# Patient Record
Sex: Male | Born: 1965 | Race: White | Hispanic: No | Marital: Single | Smoking: Never smoker
Health system: Southern US, Community
[De-identification: ages and names within clinical notes are randomized; demographics above are authoritative.]

## PROBLEM LIST (undated history)

## (undated) DIAGNOSIS — E876 Hypokalemia: Secondary | ICD-10-CM

## (undated) DIAGNOSIS — I82409 Acute embolism and thrombosis of unspecified deep veins of unspecified lower extremity: Secondary | ICD-10-CM

## (undated) DIAGNOSIS — I2699 Other pulmonary embolism without acute cor pulmonale: Secondary | ICD-10-CM

## (undated) DIAGNOSIS — I272 Pulmonary hypertension, unspecified: Secondary | ICD-10-CM

## (undated) HISTORY — DX: Hypokalemia: E87.6

## (undated) HISTORY — DX: Morbid (severe) obesity due to excess calories: E66.01

## (undated) HISTORY — DX: Acute embolism and thrombosis of unspecified deep veins of unspecified lower extremity: I82.409

## (undated) HISTORY — DX: Other pulmonary embolism without acute cor pulmonale: I26.99

## (undated) HISTORY — DX: Pulmonary hypertension, unspecified: I27.20

---

## 2014-09-04 HISTORY — PX: IVC FILTER PLACEMENT (ARMC HX): HXRAD1551

## 2015-06-10 ENCOUNTER — Telehealth: Payer: Self-pay | Admitting: Hematology

## 2015-06-10 NOTE — Telephone Encounter (Signed)
New patient appt-s/w patient and gave np appt for 10/10 @ 1:45 w/Dr. Irene Limbo.  Referring Oncology Consultant, P.A Dx- Other pulmonary embolism w/out acute cor pulmonale

## 2015-06-14 ENCOUNTER — Ambulatory Visit (HOSPITAL_BASED_OUTPATIENT_CLINIC_OR_DEPARTMENT_OTHER): Payer: BLUE CROSS/BLUE SHIELD | Admitting: Hematology

## 2015-06-14 ENCOUNTER — Telehealth: Payer: Self-pay | Admitting: Hematology

## 2015-06-14 ENCOUNTER — Encounter: Payer: Self-pay | Admitting: Hematology

## 2015-06-14 ENCOUNTER — Ambulatory Visit (HOSPITAL_BASED_OUTPATIENT_CLINIC_OR_DEPARTMENT_OTHER): Payer: BLUE CROSS/BLUE SHIELD

## 2015-06-14 VITALS — BP 128/79 | HR 81 | Temp 98.0°F | Resp 20 | Ht 72.0 in | Wt 302.0 lb

## 2015-06-14 DIAGNOSIS — I272 Other secondary pulmonary hypertension: Secondary | ICD-10-CM

## 2015-06-14 DIAGNOSIS — I82403 Acute embolism and thrombosis of unspecified deep veins of lower extremity, bilateral: Secondary | ICD-10-CM | POA: Diagnosis not present

## 2015-06-14 DIAGNOSIS — E669 Obesity, unspecified: Secondary | ICD-10-CM

## 2015-06-14 DIAGNOSIS — I2609 Other pulmonary embolism with acute cor pulmonale: Secondary | ICD-10-CM

## 2015-06-14 DIAGNOSIS — I519 Heart disease, unspecified: Secondary | ICD-10-CM

## 2015-06-14 LAB — CBC & DIFF AND RETIC
BASO%: 0.2 % (ref 0.0–2.0)
BASOS ABS: 0 10*3/uL (ref 0.0–0.1)
EOS ABS: 0.1 10*3/uL (ref 0.0–0.5)
EOS%: 1.1 % (ref 0.0–7.0)
HEMATOCRIT: 42.3 % (ref 38.4–49.9)
HEMOGLOBIN: 14.3 g/dL (ref 13.0–17.1)
IMMATURE RETIC FRACT: 2 % — AB (ref 3.00–10.60)
LYMPH#: 1.3 10*3/uL (ref 0.9–3.3)
LYMPH%: 24.5 % (ref 14.0–49.0)
MCH: 30.1 pg (ref 27.2–33.4)
MCHC: 33.8 g/dL (ref 32.0–36.0)
MCV: 89.1 fL (ref 79.3–98.0)
MONO#: 0.3 10*3/uL (ref 0.1–0.9)
MONO%: 5.4 % (ref 0.0–14.0)
NEUT#: 3.7 10*3/uL (ref 1.5–6.5)
NEUT%: 68.8 % (ref 39.0–75.0)
NRBC: 0 % (ref 0–0)
Platelets: 216 10*3/uL (ref 140–400)
RBC: 4.75 10*6/uL (ref 4.20–5.82)
RDW: 13.4 % (ref 11.0–14.6)
RETIC %: 1.03 % (ref 0.80–1.80)
RETIC CT ABS: 48.93 10*3/uL (ref 34.80–93.90)
WBC: 5.4 10*3/uL (ref 4.0–10.3)

## 2015-06-14 LAB — COMPREHENSIVE METABOLIC PANEL (CC13)
ALBUMIN: 3.9 g/dL (ref 3.5–5.0)
ALK PHOS: 50 U/L (ref 40–150)
ALT: 43 U/L (ref 0–55)
AST: 20 U/L (ref 5–34)
Anion Gap: 7 mEq/L (ref 3–11)
BILIRUBIN TOTAL: 0.44 mg/dL (ref 0.20–1.20)
BUN: 12.2 mg/dL (ref 7.0–26.0)
CALCIUM: 9.3 mg/dL (ref 8.4–10.4)
CO2: 27 mEq/L (ref 22–29)
CREATININE: 0.9 mg/dL (ref 0.7–1.3)
Chloride: 108 mEq/L (ref 98–109)
EGFR: >90 mL/min/{1.73_m2} (ref >90)
GLUCOSE: 87 mg/dL (ref 70–140)
POTASSIUM: 4 meq/L (ref 3.5–5.1)
SODIUM: 142 meq/L (ref 136–145)
TOTAL PROTEIN: 6.8 g/dL (ref 6.4–8.3)

## 2015-06-14 LAB — LACTATE DEHYDROGENASE (CC13): LDH: 269 U/L — AB (ref 125–245)

## 2015-06-14 MED ORDER — CEPHALEXIN 500 MG PO CAPS: 500.0000 mg | ORAL_CAPSULE | Freq: Four times a day (QID) | ORAL | Status: AC

## 2015-06-14 NOTE — Telephone Encounter (Signed)
Patient sent back to lab and given avs report and appointments for October including appointment with Dr. Lenna Gilford 10/12 @ 9:30 am. Patient will call to establish primary care with internal medicine and has the information.

## 2015-06-14 NOTE — Telephone Encounter (Signed)
Add to previous note.........Marland Kitchen Message to [facility] re pre Josem Kaufmann for Echo.

## 2015-06-16 ENCOUNTER — Institutional Professional Consult (permissible substitution): Payer: BLUE CROSS/BLUE SHIELD | Admitting: Pulmonary Disease

## 2015-06-18 ENCOUNTER — Telehealth: Payer: Self-pay | Admitting: Hematology

## 2015-06-18 LAB — RFX DRVVT SCR W/RFLX CONF 1:1 MIX: DRVVT SCREEN: 37 s (ref <=45)

## 2015-06-18 LAB — RFX PTT-LA W/RFX TO HEX PHASE CONF: PTT-LA Screen: 46 s — ABNORMAL HIGH (ref <=40)

## 2015-06-18 NOTE — Telephone Encounter (Signed)
Per response from [facility] preauth for echo - [place] - ref# Darleen Crocker [id number]. Spoke with patient re echo for 10/20 @ [facility] 10 am and confirmed 10/21 f/u.

## 2015-06-21 LAB — SPEP & IFE WITH QIG
ALPHA-1-GLOBULIN: 0.4 g/dL — AB (ref 0.2–0.3)
ALPHA-2-GLOBULIN: 0.7 g/dL (ref 0.5–0.9)
Albumin ELP: 3.8 g/dL (ref 3.8–4.8)
Beta 2: 0.3 g/dL (ref 0.2–0.5)
Beta Globulin: 0.4 g/dL (ref 0.4–0.6)
GAMMA GLOBULIN: 0.8 g/dL (ref 0.8–1.7)
IgA: 131 mg/dL (ref 68–379)
IgG (Immunoglobin G), Serum: 901 mg/dL (ref 650–1600)
IgM, Serum: 87 mg/dL (ref 41–251)
Total Protein, Serum Electrophoresis: 6.5 g/dL (ref 6.1–8.1)

## 2015-06-21 LAB — C-REACTIVE PROTEIN: CRP: 1.4 mg/dL — ABNORMAL HIGH (ref <0.60)

## 2015-06-21 LAB — HYPERCOAGULABLE PANEL, COMPREHENSIVE
AntiThromb III Func: 91 % activity (ref 80–120)
Anticardiolipin IgA: 5 APL U/mL (ref <22)
Anticardiolipin IgG: 11 GPL U/mL (ref <23)
Anticardiolipin IgM: 1 MPL U/mL (ref <11)
BETA-2-GLYCOPROTEIN I IGM: 2 M Units (ref <20)
Beta-2 Glyco I IgG: 0 G Units (ref <20)
Beta-2-Glycoprotein I IgA: 9 A Units (ref <20)
PROTEIN C ANTIGEN: 102 % (ref 70–140)
PROTEIN S ACTIVITY: 87 % (ref 70–150)
PROTEIN S ANTIGEN, TOTAL: 110 % (ref 70–140)
Protein C Activity: 131 % (ref 70–180)

## 2015-06-21 LAB — SEDIMENTATION RATE: SED RATE: 4 mm/h (ref 0–20)

## 2015-06-21 LAB — D-DIMER, QUANTITATIVE: D-Dimer, Quant: 1.35 ug/mL-FEU — ABNORMAL HIGH (ref 0.00–0.48)

## 2015-06-21 LAB — BRAIN NATRIURETIC PEPTIDE: Brain Natriuretic Peptide: 28.1 pg/mL (ref 0.0–100.0)

## 2015-06-21 LAB — RFLX HEXAGONAL PHASE CONFIRM: HEXAGONAL PHASE CONFIRM: NEGATIVE

## 2015-06-24 ENCOUNTER — Ambulatory Visit (HOSPITAL_COMMUNITY)
Admission: RE | Admit: 2015-06-24 | Discharge: 2015-06-24 | Disposition: A | Discharge status: Home / Self Care | Payer: BLUE CROSS/BLUE SHIELD | Source: Ambulatory Visit | Attending: Hematology | Admitting: Hematology

## 2015-06-24 DIAGNOSIS — Z86711 Personal history of pulmonary embolism: Secondary | ICD-10-CM | POA: Insufficient documentation

## 2015-06-24 DIAGNOSIS — I272 Other secondary pulmonary hypertension: Secondary | ICD-10-CM | POA: Insufficient documentation

## 2015-06-24 DIAGNOSIS — I77810 Thoracic aortic ectasia: Secondary | ICD-10-CM | POA: Diagnosis not present

## 2015-06-24 NOTE — Progress Notes (Signed)
*  PRELIMINARY RESULTS* Echocardiogram 2D Echocardiogram has been performed.  Leavy Cella 06/24/2015, 10:51 AM

## 2015-06-25 ENCOUNTER — Ambulatory Visit (HOSPITAL_BASED_OUTPATIENT_CLINIC_OR_DEPARTMENT_OTHER): Payer: BLUE CROSS/BLUE SHIELD | Admitting: Hematology

## 2015-06-25 ENCOUNTER — Encounter: Payer: Self-pay | Admitting: Hematology

## 2015-06-25 ENCOUNTER — Telehealth: Payer: Self-pay | Admitting: Hematology

## 2015-06-25 ENCOUNTER — Institutional Professional Consult (permissible substitution): Payer: BLUE CROSS/BLUE SHIELD | Admitting: Internal Medicine

## 2015-06-25 ENCOUNTER — Ambulatory Visit (INDEPENDENT_AMBULATORY_CARE_PROVIDER_SITE_OTHER): Payer: BLUE CROSS/BLUE SHIELD | Admitting: Internal Medicine

## 2015-06-25 ENCOUNTER — Ambulatory Visit (INDEPENDENT_AMBULATORY_CARE_PROVIDER_SITE_OTHER)
Admission: RE | Admit: 2015-06-25 | Discharge: 2015-06-25 | Disposition: A | Discharge status: Home / Self Care | Payer: BLUE CROSS/BLUE SHIELD | Source: Ambulatory Visit | Attending: Internal Medicine | Admitting: Internal Medicine

## 2015-06-25 ENCOUNTER — Encounter: Payer: Self-pay | Admitting: Internal Medicine

## 2015-06-25 VITALS — BP 110/78 | HR 85 | Ht 72.0 in | Wt 301.2 lb

## 2015-06-25 VITALS — BP 122/81 | HR 90 | Temp 97.7°F | Resp 19 | Ht 72.0 in | Wt 301.6 lb

## 2015-06-25 DIAGNOSIS — Z86711 Personal history of pulmonary embolism: Secondary | ICD-10-CM

## 2015-06-25 DIAGNOSIS — Z86718 Personal history of other venous thrombosis and embolism: Secondary | ICD-10-CM | POA: Diagnosis not present

## 2015-06-25 DIAGNOSIS — D6852 Prothrombin gene mutation: Secondary | ICD-10-CM

## 2015-06-25 DIAGNOSIS — I2699 Other pulmonary embolism without acute cor pulmonale: Secondary | ICD-10-CM

## 2015-06-25 DIAGNOSIS — I2609 Other pulmonary embolism with acute cor pulmonale: Secondary | ICD-10-CM

## 2015-06-25 DIAGNOSIS — Z7901 Long term (current) use of anticoagulants: Secondary | ICD-10-CM | POA: Diagnosis not present

## 2015-06-25 DIAGNOSIS — I824Y9 Acute embolism and thrombosis of unspecified deep veins of unspecified proximal lower extremity: Secondary | ICD-10-CM

## 2015-06-25 MED ORDER — APIXABAN 5 MG PO TABS: 5.0000 mg | ORAL_TABLET | Freq: Two times a day (BID) | ORAL | Status: DC

## 2015-06-25 NOTE — Progress Notes (Signed)
Xavier Randolph  HEMATOLOGY ONCOLOGY PROGRESS NOTE  Date of service: .06/25/2015  Patient Care Team: No Pcp Per Patient as PCP - General (General Practice)  Diagnosis:  #1 Etensive bilateral pulmonary embolism and LLE DVT in August 2016 #2 right lower extremity DVT thought to be triggered by long distance driving in [id number] treated with Apixaban for 6 months. 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 He notes that his left or extremity swelling has improved significantly and that he did not have to use the Keflex for developing cellulitis. He has not gotten to Jobst stockings yet that he was prescribed but intends to do that. He does have a pulmonary clinic follow-up with Dr. Melvyn Novas today. No chest pain. No shortness of breath. Notes that his distant exertion has improved significantly. He is wondering why his IVC filter can come out if at all.   REVIEW OF SYSTEMS:    10 Point review of systems of done and is negative except as noted above.  . Past Medical History  Diagnosis Date  . Morbid obesity ([place])   . Pulmonary embolism ([place])   . DVT (deep venous thrombosis) ([facility])     Left lower extremity  . Pulmonary hypertension ([facility])     Moderate to severe  . Hypokalemia     . Past Surgical History  Procedure Laterality Date  . Ivc filter placement ([facility] hx)  2016    . Social History  Substance Use Topics  . Smoking status: Never Smoker   . Smokeless tobacco: None  . Alcohol Use: Yes     Comment: occasional drinker    ALLERGIES:  has No Known Allergies.  MEDICATIONS:  Current Outpatient  Prescriptions  Medication Sig Dispense Refill  . enoxaparin (LOVENOX) 150 MG/ML injection Inject 150 mg into the skin every 12 (twelve) hours.     No current facility-administered medications for this visit.    PHYSICAL EXAMINATION: ECOG PERFORMANCE STATUS: 2 - Symptomatic, <50% confined to bed  . Filed Vitals:   06/25/15 1004  BP: 122/81  Pulse: 90  Temp: 97.7 F (36.5 C)  Resp: 19    Filed Weights   06/25/15 1004  Weight: 301 lb 9.6 oz (136.805 kg)   .Body mass index is 40.9 kg/(m^2).  GENERAL:alert, in no acute distress and comfortable SKIN: skin color, texture, turgor are normal, no rashes or significant lesions EYES: normal, conjunctiva are pink and non-injected, sclera clear OROPHARYNX:no exudate, no erythema and lips, buccal mucosa, and tongue normal  NECK: supple, no JVD, thyroid normal size, non-tender, without nodularity LYMPH:  no palpable lymphadenopathy in the cervical, axillary or inguinal LUNGS: clear to auscultation with normal respiratory effort HEART: regular rate & rhythm,  no murmurs and no lower extremity edema ABDOMEN: abdomen obese  soft, non-tender, normoactive bowel sounds  Musculoskeletal: no cyanosis of digits and no clubbing , decreased left lower extremity swelling and redness. PSYCH: alert & oriented x 3 with fluent speech NEURO: no focal motor/sensory deficits  LABORATORY DATA:   I have reviewed the data as listed  . CBC Latest Ref Rng 06/14/2015  WBC 4.0 - 10.3 10e3/uL 5.4  Hemoglobin 13.0 - 17.1 g/dL 14.3  Hematocrit 38.4 -  49.9 % 42.3  Platelets 140 - 400 10e3/uL 216    . CMP Latest Ref Rng 06/14/2015  Glucose 70 - 140 mg/dl 87  BUN 7.0 - 26.0 mg/dL 12.2  Creatinine 0.7 - 1.3 mg/dL 0.9  Sodium 136 - 145 mEq/L 142  Potassium 3.5 - 5.1 mEq/L 4.0  CO2 22 - 29 mEq/L 27  Calcium 8.4 - 10.4 mg/dL 9.3  Total Protein 6.4 - 8.3 g/dL 6.8  Total Bilirubin 0.20 - 1.20 mg/dL 0.44  Alkaline Phos 40 - 150 U/L 50  AST 5 - 34 U/L 20    ALT 0 - 55 U/L 43   Component     Latest Ref Rng 06/14/2015  Antithrombin Activity     80 - 120 % activity 91  Protein C Antigen     70 - 140 % 102  PROTEIN S ANTIGEN, TOTAL     70 - 140 % 110  Lupus Anticoagulant Eval      REPORT  Beta-2 Glyco I IgG     <20 G Units 0  Beta-2-Glycoprotein I IgM     <20 M Units 2  Beta-2-Glycoprotein I IgA     <20 A Units 9  Anticardiolipin IgA     <22 APL U/mL 5  Anticardiolipin IgG     <23 GPL U/mL 11  Anticardiolipin IgM     <11 MPL U/mL 1  Result      REPORT  Interpretation      REPORT  Reviewer      REPORT  Protein C Activity     70 - 180 % 131  Protein S Activity     70 - 150 % 87  RESULTS      REPORT (A)  Interpretation      REPORT (A)  Reviewer      REPORT  IgG (Immunoglobin G), Serum     650 - 1600 mg/dL 901  IgA     68 - 379 mg/dL 131  IgM, Serum     41 - 251 mg/dL 87  Immunofix Electr Int      *  Total Protein, Serum Electrophoresis     6.1 - 8.1 g/dL 6.5  Albumin ELP     3.8 - 4.8 g/dL 3.8  Alpha-1 Glubulin     0.2 - 0.3 g/dL 0.4 (H)  Alpha-2 Globulin     0.5 - 0.9 g/dL 0.7  Beta Globulin     0.4 - 0.6 g/dL 0.4  Beta 2     0.2 - 0.5 g/dL 0.3  Gamma Globulin     0.8 - 1.7 g/dL 0.8  Abnormal Protein Band1      NOT DET  SPE Interp.      *  COMMENT (PROTEIN ELECTROPHOR)      *  Abnormal Protein Band2      NOT DET  Abnormal Protein Band3      NOT DET  D-Dimer, Quant     0.00 - 0.48 ug/mL-FEU 1.35 (H)  CRP     <0.60 mg/dL 1.4 (H)  Sed Rate     0 - 20 mm/hr 4  Brain Natriuretic Peptide     0.0 - 100.0 pg/mL 28.1  LDH     125 - 245 U/L 269 (H)  Hexagonal Phase Confirm     Negative Negative    RADIOGRAPHIC STUDIES: I have personally reviewed the radiological images as listed and agreed with the findings in the report. Dg Chest 2 View  06/25/2015  CLINICAL DATA:  Pulmonary embolism EXAM: CHEST  2 VIEW COMPARISON:  None. FINDINGS: Lungs are under aerated with basilar atelectasis. Normal heart  size. No pneumothorax. No pleural effusion. IMPRESSION: Basilar atelectasis. Electronically Signed   By: Marybelle Killings M.D.   On: 06/25/2015 13:47    echocardiogram: 06/24/2015  *Xavier Randolph.            [place], [place] [address]              [phone]  ------------------------------------------------------------------- Echocardiography  Patient:  Bridget, [place] MR #:    [id number] Study Date: 06/24/2015 Gender:   M Age:    86 Height:   182.9 cm Weight:   137 kg BSA:    2.7 m^2 Pt. Status: Room:  SONOGRAPHER Leavy Cella PERFORMING  [facility], Outpatient ATTENDING  [place], [place], [place] K[place]ore REFERRING  [place], [place] Kishore  cc:  ------------------------------------------------------------------- LV EF: 55% -  60%  ------------------------------------------------------------------- Indications:   Dyspnea 786.09.  ------------------------------------------------------------------- History:  PMH: Pulmonary Embolism. No prior cardiac history.  ------------------------------------------------------------------- Study Conclusions  - Left ventricle: The cavity size was normal. There was mild concentric hypertrophy. Systolic function was normal. The estimated ejection fraction was in the range of 55% to 60%. Wall motion was normal; there were no regional wall motion abnormalities. - Aorta: Mildly dilated aortic root at sinus of Valsalva - 4.12cm. - Right ventricle: The cavity size was normal. Wall thickness was normal. Systolic function was normal.  Echocardiography. M-mode, complete 2D, spectral Doppler, and color Doppler. Birthdate: Patient birthdate: 03/14/1966. Age: Patient is 49 yr old. Sex: Gender: male.  BMI: 41 kg/m^2. Blood pressure:   128/79 Patient status:  Outpatient. Study date: Study date: 06/24/2015. Study time: 10:06 AM. Location: Echo laboratory.  -------------------------------------------------------------------  ------------------------------------------------------------------- Left ventricle: The cavity size was normal. There was mild concentric hypertrophy. Systolic function was normal. The estimated ejection fraction was in the range of 55% to 60%. Wall motion was normal; there were no regional wall motion abnormalities. There was no evidence of elevated ventricular filling pressure by Doppler parameters.  ------------------------------------------------------------------- Aortic valve:  Trileaflet; normal thickness leaflets. Mobility was not restricted. Doppler: Transvalvular velocity was within the normal range. There was no stenosis. There was no regurgitation.  ------------------------------------------------------------------- Aorta: Mildly dilated aortic root at sinus of Valsalva - 4.12cm. Ascending aorta: The ascending aorta was mildly dilated.  ------------------------------------------------------------------- Mitral valve:  Structurally normal valve.  Mobility was not restricted. Doppler: Transvalvular velocity was within the normal range. There was no evidence for stenosis. There was no regurgitation.  Peak gradient (D): 2 mm Hg.  ------------------------------------------------------------------- Left atrium: The atrium was at the upper limits of normal in size.  ------------------------------------------------------------------- Right ventricle: The cavity size was normal. Wall thickness was normal. Systolic function was normal.  ------------------------------------------------------------------- Pulmonic valve:  Poorly visualized. Doppler: Transvalvular velocity was within the normal range. There was no evidence  for stenosis.  ------------------------------------------------------------------- Tricuspid valve:  Structurally normal valve.  Doppler: Transvalvular velocity was within the normal range. There was trivial regurgitation.  ------------------------------------------------------------------- Pulmonary artery:  The main pulmonary artery was normal-sized. Systolic pressure was within the normal range.  ------------------------------------------------------------------- Right atrium: The atrium was normal in size.  ------------------------------------------------------------------- Pericardium: There was no pericardial effusion.  ------------------------------------------------------------------- Systemic veins: Inferior vena cava: The vessel was normal in size.   ASSESSMENT &  PLAN:    49 year old gentleman with   #1 Extensive bilateral pulmonary embolism with left lower extremity DVT. Patient has a family history with his dad having some kind of clotting disorder. Hypercoagulable workup shows heterozygous prothrombin gene mutation which increases risk of venous thromboembolic 4-7 times a day and will population.  He has previously had a right lower extremity DVT in 2014 that was thought to be related to long distance driving and was treated with Apixaban for 6 months. 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  #2 [place] pulmonary hypertension with right ventricular pressure overload related to extensive PE worse is chronic venous thromboembolism versus other etiology of pulmonary hypertension such as sleep apnea or COPD. Patient has been a lifelong nonsmoker. Echocardiogram yesterday showed apparently normal pulmonary pressures with normal right ventricular function. Normal left ventricular ejection fraction.  #3 IVC filter placement done about 8weeks ago due to extensive PE's.  Plan -Lab results including  significant heterozygous prothrombin gene mutation discussed in detail with the patient. -Recommended biological relatives might consider testing if they so desire. -Would continue Lovenox 150 mg subcutaneous every 12 hours to complete an additional week which would be 4 weeks of that. -Given prescription for Apixaban 5 mg by mouth twice a day to continue anticoagulation after overlap transition without repeat loading of the Apixaban since patient has been on Lovenox. -The patient tolerates Apixaban for a month no issues of worsening symptoms or bleeding will refer him to IR to consider IVC filter removal. We shall also get a repeat ultrasound prior time visit for removal to rule out any free-floating or high-risk remaining venous thrombosis. -Patient will be seen pulmonary today. -He was recommended using Jobst stockings to reduce symptoms of post-thrombotic syndrome. -He was given a referral to set up a primary care physician to ensure he gets age-appropriate cancer screening. -Slightly elevated LDH likely nonspecific. -We decided to pursue lifelong anticoagulation with Apixaban tolerated. After 1 year of treatment might consider stepping down to lower dose at 2.5 mg by mouth twice a day.   All of the patient's questions were answered to his apparent satisfaction. The patient knows to call the clinic with any problems, questions or concerns. I spent 20 minutes counseling the patient face to face. The total time spent in the appointment was 25 minutes and more than 50% was on counseling and direct patient cares.    Sullivan Lone MD [place] AAHIVMS [facility] Hematology/Oncology Physician [facility]  (Office):       [phone] (Work cell):  [phone] (Fax):           [phone]

## 2015-06-25 NOTE — Telephone Encounter (Signed)
s.w. pt and confirmed 11.18 appts....pt ok and aware

## 2015-06-25 NOTE — Assessment & Plan Note (Addendum)
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 The issue really is whether the filter can eventually be removed but this decision will need to be made by IR after review of the records from [place] using the strict  criteria that our department employs considering removal. From a pulmonary perspective he has excellent pulmonary vascular reserve and his echocardiogram is very reassuring in this regard.  Since he has being managed by hematology and has a partial protein S deficiency, I am deferring all management of his anticoagulation to hematology and we can see him here on a prn basis  Total time devoted to counseling  = 24m/58m ov review case with pt/ discussion of options/alternatives/ giving and going over instructions (see avs)

## 2015-06-25 NOTE — Telephone Encounter (Signed)
Gave adn printed appt sched and avs for pt NOV.....lvm for doppler to call me back to sched doppler

## 2015-06-25 NOTE — Assessment & Plan Note (Addendum)
Body mass index is 40.84 kg/(m^2).  No results found for: TSH   Contributing to venous insufficiency and probably contributed to previous DVT/  reviewed the need and the process to achieve and maintain neg calorie balance > defer f/u primary care including intermittently monitoring thyroid status

## 2015-06-25 NOTE — Progress Notes (Signed)
Subjective:    Patient ID: Xavier Randolph, male    DOB: Oct 02, 1965,    MRN: [id number]  HPI   10 yowm never smoker morbidly obese with DVT R leg 2014 rx 6 m eliquis  with lots of interstate travel [place] to [place] in summer of 2016 then cp/sob / syncope> admit to [facility] > dx PE > filter placed and rx tpa then d/c on [facility]man >  Missed a few doses > symptoms recurred but repeat CT s  New clots > changed to Lovenox and fine since then > eval by Irene Limbo and plan to change to eliquis> referred to pulmonary clinic 06/25/2015 by Dr Irene Limbo     06/25/2015 1st [facility] office visit/ Xavier Randolph   Chief Complaint  Patient presents with  . Pulmonary Consult    Referred by Dr. Irene Limbo. Pt states had PE and DVT August 2016 while he was in [place].    his cough and sob have resolved but he still has R > L leg swelling, slt worse as day goes on  No  cp or chest tightness, subjective wheeze overt sinus or hb symptoms. No unusual exp hx or h/o childhood pna/ asthma or knowledge of premature birth.  Sleeping ok without nocturnal  or early am exacerbation  of respiratory  c/o's or need for noct saba. Also denies any obvious fluctuation of symptoms with weather or environmental changes or other aggravating or alleviating factors except as outlined above   Current Medications, Allergies, Complete Past Medical History, Past Surgical History, Family History, and Social History were reviewed in [employer] record.           Review of Systems  Constitutional: Negative for fever, chills, activity change, appetite change and unexpected weight change.  HENT: Negative for congestion, dental problem, postnasal drip, rhinorrhea, sneezing, sore throat, trouble swallowing and voice change.   Eyes: Negative for visual disturbance.  Respiratory: Negative for cough, choking and shortness of breath.   Cardiovascular: Negative for chest pain and leg swelling.  Gastrointestinal: Negative for  nausea, vomiting and abdominal pain.  Genitourinary: Negative for difficulty urinating.  Musculoskeletal: Negative for arthralgias.  Skin: Negative for rash.  Psychiatric/Behavioral: Negative for behavioral problems and confusion.       Objective:   Physical Exam  amb obese wm nad  Wt Readings from Last 3 Encounters:  06/25/15 301 lb 3.2 oz (136.623 kg)  06/25/15 301 lb 9.6 oz (136.805 kg)  06/14/15 302 lb (136.986 kg)    Vital signs reviewed   HEENT: nl dentition, turbinates, and orophanx. Nl external ear canals without cough reflex   NECK :  without JVD/Nodes/TM/ nl carotid upstrokes bilaterally   LUNGS: no acc muscle use, clear to A and P bilaterally without cough on insp or exp maneuvers   CV:  RRR  no s3 or murmur or increase in P2, no edema   ABD:  soft and nontender with nl excursion in the supine position. No bruits or organomegaly, bowel sounds nl  MS:  warm without deformities, calf tenderness, cyanosis or clubbing  SKIN: warm and dry without lesions    NEURO:  alert, approp, no deficits     CXR PA and Lateral:   06/25/2015 :    I personally reviewed images and agree with radiology impression as follows:   Lungs are under aerated with basilar atelectasis. Normal heart size. No pneumothorax. No pleural effusion. While I agree that the lung volumes are reduced, I see no evidence  of any segmental or subsegmental atelectasis or effusions - this could all be due to obesity            Assessment & Plan:

## 2015-06-25 NOTE — Patient Instructions (Addendum)
Please remember to go to the  x-ray department downstairs for your tests - we will call you with the results when they are available.  Your echo is now normal and the only issue is whether our IR dept can remove the filter - defer this to Dr Irene Limbo to arrange   You will some form of anticoagulation for life due to recurrent clot and partial Protein S deficiency   Keep up the good work on the weight loss, the most important aspect of your care.  Pulmonary follow up  is as needed

## 2015-06-25 NOTE — Progress Notes (Signed)
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 Acute cor pulmonale and right ventricular strain  HISTORY OF PRESENTING ILLNESS:   Xavier Randolph is a wonderful 49 y.o. male who has been referred to [place] by Dr Aneta Mins for evaluation and management of DVT and PE.  Patient has a history of morbid obesity and previously had a right lower extremity DVT in 2014 which was thought to be triggered by long distance driving from [place] to [place]. Patient notes he was treated succesfully with Eliquis for 6 months with resolution of his DVT. He notes that in August 2016 He presented with worsening left lower extremity swelling and new cough associated with shortness of breath and dyspnea on exertion. He was evaluated and found to have a left lower extremity DVT and pulmonary embolism and was apparently started on Fragmin once a day. Patient notes that he was discharged and continued to have long distance driving between [place] and [place] and about a month after being on Fragmin presented again to the hospital in [place] with cough and significant increasing shortness of breath and dyspnea on exertion. He did report having missed 2 doses of his Fragmin. He notes that he had an evaluation for CTA of the chest on 05/31/2015 which showed bilateral extensive pulmonary emboli similar to a CT of the chest done previously on 04/25/2015 consistent with persistent/worsened pulmonary embolism. Dilatation of the main pulmonary artery was noted consistent with pulmonary hypertension. Enlargement of the right ventricle and anterior ventricle septal finding is consistent with right heart strain. Multiple pulmonary infiltrates were noted which were thought to be inflammation but could not rule out impending infarcts,  hepatic attenuation defects noted.   Echocardiogram was done which showed an ejection fraction of 60-65%. Septal flattening of the intraventricular septum consistent with right ventricular volume or pressure overload. No pericardial effusion.   He notes that he was then switched to Lovenox 150 mg subcutaneously twice daily. His hematologist recommended lifelong anticoagulation.  He has been on this for about 2 weeks and notes that his breathing is somewhat better and his left lower extremity swelling is very gradually improving. He notes that there is some persistent redness in the left lower extremity and he is concerned if he is developing a cellulitis. No overt chest pain at this time. Patient is ambulating without oxygen.Marland Kitchen  He is transferring care to [place] due to his job transfer that brought him to [place].   Notes he has never smoked. Rarely uses alcohol socially. Works as an Audiological scientist. Notes that his father had history of blood clots and was on Coumadin.   He is here for further recommendations.  MEDICAL HISTORY:  Past Medical History  Diagnosis Date  . Morbid obesity ([place])   . Pulmonary embolism ([place])   . DVT (deep venous thrombosis) (HCC)     Left lower extremity  . Pulmonary hypertension (HCC)     Moderate to severe  . Hypokalemia   Left lower extremity cellulitis in 2012 with history of chronic swelling.  Right lower extremity DVT in 2014 thought to be triggered by long distance driving treated with Apixaban for 6 months     SURGICAL HISTORY: Past Surgical History  Procedure Laterality Date  . Ivc filter placement ([facility] hx)  2016  SOCIAL HISTORY: Social History   Social History  . Marital Status: Single    Spouse Name: N/A  . Number of Children: N/A  . Years of Education: N/A   Occupational History  . [employer]    Social History Main Topics  . Smoking status: Never Smoker   . Smokeless tobacco: Not on file  .  Alcohol Use: Yes     Comment: occasional drinker  . Drug Use: No  . Sexual Activity: No   Other Topics Concern  . Not on file   Social History Narrative    FAMILY HISTORY: Family History  Problem Relation Age of Onset  . Clotting disorder Father   . COPD Father     smoked  . Colon cancer Father   . Bladder Cancer Mother     ALLERGIES:  has No Known Allergies.  MEDICATIONS:  Current Outpatient Prescriptions  Medication Sig Dispense Refill  . enoxaparin (LOVENOX) 150 MG/ML injection Inject 150 mg into the skin every 12 (twelve) hours.     No current facility-administered medications for this visit.    REVIEW OF SYSTEMS:    10 Point review of Systems was done is negative except as noted above.  PHYSICAL EXAMINATION: ECOG PERFORMANCE STATUS: 1 - Symptomatic but completely ambulatory  . Filed Vitals:   06/14/15 1406  BP: 128/79  Pulse: 81  Temp: 98 F (36.7 C)  Resp: 20   Filed Weights   06/14/15 1406  Weight: 302 lb (136.986 kg)   .Body mass index is 40.95 kg/(m^2).  GENERAL:alert, in no acute distress and comfortable SKIN: skin color, texture, turgor are normal, no rashes or significant lesions EYES: normal, conjunctiva are pink and non-injected, sclera clear OROPHARYNX:no exudate, no erythema and lips, buccal mucosa, and tongue normal  NECK: supple, no JVD, thyroid normal size, non-tender, without nodularity LYMPH:  no palpable lymphadenopathy in the cervical, axillary or inguinal LUNGS: clear to auscultation with normal respiratory effort HEART: regular rate & rhythm,  no murmurs and no lower extremity edema ABDOMEN: abdomen soft, non-tender, normoactive bowel sounds  Musculoskeletal: no cyanosis of digits and no clubbing  PSYCH: alert & oriented x 3 with fluent speech NEURO: no focal motor/sensory deficits  LABORATORY DATA:  I have reviewed the data as listed  . CBC Latest Ref Rng 06/14/2015  WBC 4.0 - 10.3 10e3/uL 5.4  Hemoglobin 13.0 - 17.1  g/dL 14.3  Hematocrit 38.4 - 49.9 % 42.3  Platelets 140 - 400 10e3/uL 216    . CMP Latest Ref Rng 06/14/2015  Glucose 70 - 140 mg/dl 87  BUN 7.0 - 26.0 mg/dL 12.2  Creatinine 0.7 - 1.3 mg/dL 0.9  Sodium 136 - 145 mEq/L 142  Potassium 3.5 - 5.1 mEq/L 4.0  CO2 22 - 29 mEq/L 27  Calcium 8.4 - 10.4 mg/dL 9.3  Total Protein 6.4 - 8.3 g/dL 6.8  Total Bilirubin 0.20 - 1.20 mg/dL 0.44  Alkaline Phos 40 - 150 U/L 50  AST 5 - 34 U/L 20  ALT 0 - 55 U/L 43     ASSESSMENT & PLAN:   49 year old gentleman with   #1 Extensive bilateral pulmonary embolism with left lower extremity DVT. Patient has a family history with his dad having some kind of clotting disorder. He has previously had a right lower extremity DVT in 2014 that was thought to be related to long distance driving and was treated with Apixaban for 6 months. Acquired modifiable risk factors-obesity, long periods of immobility with long-distance car  driving.  #2 moderate pulmonary hypertension with right ventricular pressure overload related to extensive PE worse is chronic venous thromboembolism versus other etiology of pulmonary hypertension such as sleep apnea or COPD. Patient has been a lifelong nonsmoker.  #3 IVC filter placement done about 6 weeks ago due to extensive PE's.  Plan Outside records reviewed in detail -Hypercoagulable workup for, and heteditary and acquired risk factors for venous thromboembolism. -We will repeat an echocardiogram to evaluate improvement in right ventricular function and status of pulmonary pressures . -Continue Lovenox 150 mg subcutaneously every 12 h to complete at least the first 4 weeks . -Given prescription for Keflex given possibility of early left lower extremity redness in case that his signs of progressive cellulitis. -Pulmonary referral given for evaluation of other causes of pulmonary hypertension . -We will get a baseline d-dimer level. -We discussed the fact that if his symptoms  continue to improve and left lower leg swelling is better on Lovenox he might consider switching him to Apixaban after 4 weeks of Lovenox . -I discussed that we will likely recommend lifelong anticoagulation unless he has any issues with bleeding. -We discussed that with his weight it is difficult to determine adequacy of anticoagulation with standard dose Apixaban .however he has tolerated this and used successfully in the past in 2014 when his weight was even higher . -He has an IVC filter which we would intend to remove them in the next month or so after determining anticoagulation tolerability. -Return to care with Dr. Irene Limbo in 2 weeks for discussion of bowel workup results and and anticoagulation planning . -Patient given referral to set up a primary care physician for other medical cares, age-appropriate cancer screening and continued cares.  All of the patient's questions were answered  to his apparent satisfaction. The patient knows to call the clinic with any problems, questions or concerns.  I spent 50 minutes counseling the patient face to face. The total time spent in the appointment was 65 minutes and more than 50% was on counseling and direct patient cares.    Sullivan Lone MD [place] AAHIVMS [facility] Hematology/Oncology Physician [place]  (Office):       [phone] (Work cell):  [phone] (Fax):           [phone]

## 2015-06-28 NOTE — Progress Notes (Signed)
Quick Note:  Called and spoke to pt. Informed him of the results and recs per MW. Pt verbalized understanding and denied any further questions or concerns at this time.   ______ 

## 2015-07-01 ENCOUNTER — Encounter: Payer: Self-pay | Admitting: Hematology

## 2015-07-01 ENCOUNTER — Telehealth: Payer: Self-pay | Admitting: *Deleted

## 2015-07-01 NOTE — Telephone Encounter (Signed)
Patient called reporting "[facility] informed me the medicine Dr. Irene Limbo ordered needs prior authorization.  I need to start this tomorrow.  Would someone call me when approved?"  This nurse confirmed Pharmacy.  Called Pharmacy requested faxed Prior authorization request.  [facility] in [facility] faxed Prior authorization request for Xavier Randolph 5 mg.  Request to Managed Care for review stamped as urgent and to call patient at [phone] when approved.

## 2015-07-01 NOTE — Progress Notes (Signed)
Per Dawn with [facility]  eliquis approved thru 08/30/15  Auth# [id number] and she will let patient know it is approved.

## 2015-07-06 NOTE — Telephone Encounter (Signed)
Progress Notes   Xavier Randolph (MR# [id number])      Progress Notes Info    Author Note Status Last Update User Last Update Date/Time   Zaylister Andris Baumann Signed Mariam Dollar 07/01/2015 10:01 AM    Progress Notes    Expand All Collapse All   Per Dawn with [facility] eliquis approved thru 08/30/15 Auth# [id number] and she will let patient know it is approved.

## 2015-07-22 ENCOUNTER — Other Ambulatory Visit: Payer: Self-pay | Admitting: *Deleted

## 2015-07-23 ENCOUNTER — Other Ambulatory Visit: Payer: BLUE CROSS/BLUE SHIELD

## 2015-07-23 ENCOUNTER — Ambulatory Visit (HOSPITAL_COMMUNITY)
Admission: RE | Admit: 2015-07-23 | Discharge: 2015-07-23 | Disposition: A | Discharge status: Home / Self Care | Payer: BLUE CROSS/BLUE SHIELD | Source: Ambulatory Visit | Attending: Hematology | Admitting: Hematology

## 2015-07-23 DIAGNOSIS — Z86718 Personal history of other venous thrombosis and embolism: Secondary | ICD-10-CM | POA: Insufficient documentation

## 2015-07-23 DIAGNOSIS — M7989 Other specified soft tissue disorders: Secondary | ICD-10-CM | POA: Insufficient documentation

## 2015-07-23 DIAGNOSIS — I824Y9 Acute embolism and thrombosis of unspecified deep veins of unspecified proximal lower extremity: Secondary | ICD-10-CM | POA: Diagnosis not present

## 2015-07-23 DIAGNOSIS — I272 Other secondary pulmonary hypertension: Secondary | ICD-10-CM | POA: Insufficient documentation

## 2015-07-23 DIAGNOSIS — Z86711 Personal history of pulmonary embolism: Secondary | ICD-10-CM | POA: Diagnosis not present

## 2015-07-23 NOTE — Progress Notes (Signed)
VASCULAR LAB PRELIMINARY  PRELIMINARY  PRELIMINARY  PRELIMINARY  Bilateral lower extremity venous duplex completed.    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 Left - No obvious evidence of DVT. Bilateral - No evidence of a superficial thrombosis or Baker's cyst.  Delphia Kaylor, RVS 07/23/2015, 1:29 PM

## 2015-07-27 ENCOUNTER — Ambulatory Visit (HOSPITAL_BASED_OUTPATIENT_CLINIC_OR_DEPARTMENT_OTHER): Payer: BLUE CROSS/BLUE SHIELD | Admitting: Hematology

## 2015-07-27 VITALS — BP 129/84 | HR 79 | Temp 97.7°F | Resp 19 | Ht 72.0 in | Wt 309.7 lb

## 2015-07-27 DIAGNOSIS — I2699 Other pulmonary embolism without acute cor pulmonale: Secondary | ICD-10-CM | POA: Diagnosis not present

## 2015-07-27 MED ORDER — APIXABAN 5 MG PO TABS: 5.0000 mg | ORAL_TABLET | Freq: Two times a day (BID) | ORAL | Status: DC

## 2015-07-28 ENCOUNTER — Encounter: Payer: Self-pay | Admitting: Hematology

## 2015-07-28 NOTE — Progress Notes (Signed)
Xavier Randolph  HEMATOLOGY ONCOLOGY PROGRESS NOTE  Date of service: .07/27/2015  Patient Care Team: No Pcp Per Patient as PCP - General (General Practice)  CC: follow-up for management of venous thromboembolism with recurrent PE  Diagnosis:  1) recurrent venous thromboembolism somewhat unprovoked events.  Improving factors including long distance driving, obesity, heterozygous prothrombin gene mutation. #2 right lower extremity DVT thought to be triggered by long distance driving in [id number] treated with Apixaban for 6 months. #3 pulmonary hypertension moderate to severe- chronic venous thromboembolism worsens acute right ventricular pressure overload versus other etiology for chronic pulmonary hypertension #4 newly diagnosed heterozygous prothrombin gene mutation  Current Treatment:  Eliquis 5mg  po BID  INTERVAL HISTORY: Xavier Randolph is referred for follow-up after his last clinic visit on 06/25/2015. He has transitioned from Lovenox to Eliquis about a month ago and notes no acute new concerns. His left leg swelling continues to resolve. Repeat ultrasound of the left lower extremity shows only a small amount of chronic clot. Breathing is stable and improved. No new shortness of breath, exertional dyspnea or chest pain. He notes that he'll be traveling to [place] for the Thanksgiving holidays. He would like to have his IVC filter removed sometime around mid December. No issues with compliance with the Eliquis.  REVIEW OF SYSTEMS:    10 Point review of systems of done and is negative except as noted above.  . Past Medical History  Diagnosis Date  . Morbid obesity ([place])   . Pulmonary embolism ([place])   . DVT (deep venous thrombosis) (HCC)     Left lower extremity  . Pulmonary hypertension (HCC)     Moderate to severe  . Hypokalemia     . Past Surgical History  Procedure Laterality Date  . Ivc filter placement ([facility] hx)  2016    . Social History  Substance Use Topics  . Smoking status: Never  Smoker   . Smokeless tobacco: Not on file  . Alcohol Use: Yes     Comment: occasional drinker    ALLERGIES:  has No Known Allergies.  MEDICATIONS:  Current Outpatient Prescriptions  Medication Sig Dispense Refill  . apixaban (ELIQUIS) 5 MG TABS tablet Take 1 tablet (5 mg total) by mouth 2 (two) times daily. 60 tablet 4   No current facility-administered medications for this visit.    PHYSICAL EXAMINATION: ECOG PERFORMANCE STATUS: 1 - Symptomatic but completely ambulatory  . Filed Vitals:   07/27/15 1611  BP: 129/84  Pulse: 79  Temp: 97.7 F (36.5 C)  Resp: 19    Filed Weights   07/27/15 1611  Weight: 309 lb 11.2 oz (140.479 kg)   .Body mass index is 41.99 kg/(m^2).  GENERAL:alert, in no acute distress and comfortable SKIN: skin color, texture, turgor are normal, no rashes or significant lesions EYES: normal, conjunctiva are pink and non-injected, sclera clear OROPHARYNX:no exudate, no erythema and lips, buccal mucosa, and tongue normal  NECK: supple, no JVD, thyroid normal size, non-tender, without nodularity LYMPH: no palpable lymphadenopathy in the cervical, axillary or inguinal LUNGS: clear to auscultation with normal respiratory effort HEART: regular rate & rhythm, no murmurs and no lower extremity edema ABDOMEN: abdomen obese soft, non-tender, normoactive bowel sounds  Musculoskeletal: no cyanosis of digits and no clubbing , decreased left lower extremity swelling and redness. PSYCH: alert & oriented x 3 with fluent speech NEURO: no focal motor/sensory deficits LABORATORY DATA:   I have reviewed the data as listed  . CBC Latest Ref Rng 06/14/2015  WBC  4.0 - 10.3 10e3/uL 5.4  Hemoglobin 13.0 - 17.1 g/dL 14.3  Hematocrit 38.4 - 49.9 % 42.3  Platelets 140 - 400 10e3/uL 216    . CMP Latest Ref Rng 06/14/2015  Glucose 70 - 140 mg/dl 87  BUN 7.0 - 26.0 mg/dL 12.2  Creatinine 0.7 - 1.3 mg/dL 0.9  Sodium 136 - 145 mEq/L 142  Potassium 3.5 - 5.1 mEq/L  4.0  CO2 22 - 29 mEq/L 27  Calcium 8.4 - 10.4 mg/dL 9.3  Total Protein 6.4 - 8.3 g/dL 6.8  Total Bilirubin 0.20 - 1.20 mg/dL 0.44  Alkaline Phos 40 - 150 U/L 50  AST 5 - 34 U/L 20  ALT 0 - 55 U/L 43   Lupus anticoagulant negative Heterozygous prothrombin gene mutation noted  RADIOGRAPHIC STUDIES: I have personally reviewed the radiological images as listed and agreed with the findings in the report. No results found.   [place] ext 07/23/2015: Summary:  - Mild technical difficulty due to edema and body habitus. - Findings consistent with chronic deep vein thrombosis involving a  small segment of the popliteal vein in the right lower extremity. - No obvious evidence of deep vein thrombosis involving the left  lower extremity. - Unable to visualize the peroneal veins bilaterally well enough to  evaluate. Unable to visualize due due to edema and body habitus. - No obvious evidence of superficial thrombosis of the right or  left lower extremity.  Other specific details can be found in the table(s) above. Prepared and Electronically Authenticated by  Gae Gallop MD 2016-11-18T17:03:40  ASSESSMENT & PLAN:   49 year old gentleman with   #1 Extensive bilateral pulmonary embolism with Right lower extremity DVT. Patient has a family history with his dad having some kind of clotting disorder. Hypercoagulable workup shows heterozygous prothrombin gene mutation which increases risk of venous thromboembolic 4-7 times the general population. He has previously had a right lower extremity DVT in 2014 that was thought to be related to long distance driving and was treated with Apixaban for 6 months. 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 Lower extremities#2 Moderate pulmonary hypertension with right ventricular pressure overload related to extensive PE worse is chronic venous thromboembolism versus other etiology of pulmonary  hypertension such as sleep apnea or COPD. Patient has been a lifelong nonsmoker. Echocardiogram yesterday showed apparently normal pulmonary pressures with normal right ventricular function. Normal left ventricular ejection fraction.  #3 IVC filter placement done about 12weeks ago due to extensive PE's.  Plan -patient transitioned to Apixaban 5 mg by mouth twice a day from Lovenox about 4 weeks ago and has been tolerating it well without any new concerns. -Repeat ultrasound of right lower extremity shows only residual small segment chronic DVT in his popliteal vein and no high risk remaining thrombosis. -Reasonable to evaluate him for IVC filter removal by interventional radiology. Referral given for 08/18/2015 or 08/19/2015 as per patient preference. -continue using Jobst stockings to reduce symptoms of post-thrombotic syndrome. -he was counseled on the importance of compliance with his Apixaban. -We decided to pursue lifelong anticoagulation with Apixaban if tolerated. After 6-12 months of treatment might consider stepping down to lower dose at 2.5 mg by mouth twice a day.  Return to care with Dr.Kale in 3 months when he will have completed 6 months of treatment. At that time we make a decision regarding ongoing therapeutic anticoagulation versus stepdown dose of continued anticoagulation.  I spent 15 minutes counseling the patient face to face.  The total time spent in the appointment was 20 minutes and more than 50% was on counseling and direct patient cares.    Sullivan Lone MD [place] AAHIVMS [facility] Hematology/Oncology Physician [facility]  (Office):       [phone] (Work cell):  [phone] (Fax):           [phone]

## 2015-08-09 ENCOUNTER — Telehealth: Payer: Self-pay | Admitting: Hematology

## 2015-08-09 NOTE — Telephone Encounter (Signed)
Appointments completed by [place] per 11/22 pof. Not able to reach patient or leave message re appointments. Mailed schedule for February 2017 as well as IVC filter removal on schedule for 12/15.

## 2015-08-18 ENCOUNTER — Other Ambulatory Visit (HOSPITAL_COMMUNITY): Payer: BLUE CROSS/BLUE SHIELD

## 2015-08-19 ENCOUNTER — Other Ambulatory Visit (HOSPITAL_COMMUNITY): Payer: BLUE CROSS/BLUE SHIELD

## 2015-08-19 ENCOUNTER — Ambulatory Visit (HOSPITAL_COMMUNITY): Payer: BLUE CROSS/BLUE SHIELD

## 2015-10-28 ENCOUNTER — Ambulatory Visit: Payer: BLUE CROSS/BLUE SHIELD | Admitting: Hematology

## 2015-10-28 ENCOUNTER — Other Ambulatory Visit: Payer: BLUE CROSS/BLUE SHIELD

## 2015-12-13 ENCOUNTER — Inpatient Hospital Stay (HOSPITAL_COMMUNITY)
Admission: AD | Admit: 2015-12-13 | Discharge: 2015-12-13 | DRG: 176 | Disposition: A | Discharge status: Home / Self Care | Payer: BLUE CROSS/BLUE SHIELD | Source: Other Acute Inpatient Hospital | Attending: Internal Medicine | Admitting: Internal Medicine

## 2015-12-13 ENCOUNTER — Inpatient Hospital Stay (HOSPITAL_BASED_OUTPATIENT_CLINIC_OR_DEPARTMENT_OTHER): Payer: BLUE CROSS/BLUE SHIELD

## 2015-12-13 ENCOUNTER — Encounter (HOSPITAL_COMMUNITY): Payer: Self-pay | Admitting: *Deleted

## 2015-12-13 DIAGNOSIS — Z6841 Body Mass Index (BMI) 40.0 and over, adult: Secondary | ICD-10-CM

## 2015-12-13 DIAGNOSIS — D6852 Prothrombin gene mutation: Secondary | ICD-10-CM | POA: Diagnosis present

## 2015-12-13 DIAGNOSIS — Z86711 Personal history of pulmonary embolism: Secondary | ICD-10-CM

## 2015-12-13 DIAGNOSIS — I2699 Other pulmonary embolism without acute cor pulmonale: Secondary | ICD-10-CM

## 2015-12-13 DIAGNOSIS — Z7901 Long term (current) use of anticoagulants: Secondary | ICD-10-CM

## 2015-12-13 DIAGNOSIS — Z86718 Personal history of other venous thrombosis and embolism: Secondary | ICD-10-CM | POA: Diagnosis not present

## 2015-12-13 DIAGNOSIS — I82409 Acute embolism and thrombosis of unspecified deep veins of unspecified lower extremity: Secondary | ICD-10-CM

## 2015-12-13 DIAGNOSIS — I272 Other secondary pulmonary hypertension: Secondary | ICD-10-CM | POA: Diagnosis present

## 2015-12-13 DIAGNOSIS — R0602 Shortness of breath: Secondary | ICD-10-CM | POA: Diagnosis present

## 2015-12-13 LAB — CBC
HCT: 42.1 % (ref 39.0–52.0)
Hemoglobin: 14.2 g/dL (ref 13.0–17.0)
MCH: 30.1 pg (ref 26.0–34.0)
MCHC: 33.7 g/dL (ref 30.0–36.0)
MCV: 89.4 fL (ref 78.0–100.0)
PLATELETS: 149 10*3/uL — AB (ref 150–400)
RBC: 4.71 MIL/uL (ref 4.22–5.81)
RDW: 13.7 % (ref 11.5–15.5)
WBC: 6.9 10*3/uL (ref 4.0–10.5)

## 2015-12-13 LAB — BASIC METABOLIC PANEL
ANION GAP: 10 (ref 5–15)
BUN: 15 mg/dL (ref 6–20)
CO2: 26 mmol/L (ref 22–32)
CREATININE: 0.99 mg/dL (ref 0.61–1.24)
Calcium: 8.8 mg/dL — ABNORMAL LOW (ref 8.9–10.3)
Chloride: 109 mmol/L (ref 101–111)
GFR calc Af Amer: >60 mL/min (ref >60)
GFR calc non Af Amer: >60 mL/min (ref >60)
Glucose, Bld: 101 mg/dL — ABNORMAL HIGH (ref 65–99)
POTASSIUM: 3.8 mmol/L (ref 3.5–5.1)
SODIUM: 145 mmol/L (ref 135–145)

## 2015-12-13 LAB — MRSA PCR SCREENING: MRSA BY PCR: POSITIVE — AB

## 2015-12-13 LAB — ECHOCARDIOGRAM COMPLETE
Height: 73 in
WEIGHTICAEL: 4910.09 [oz_av]

## 2015-12-13 LAB — HEPARIN LEVEL (UNFRACTIONATED): Heparin Unfractionated: 0.42 IU/mL (ref 0.30–0.70)

## 2015-12-13 MED ORDER — APIXABAN 5 MG PO TABS: 5.0000 mg | ORAL_TABLET | Freq: Two times a day (BID) | ORAL | Status: DC

## 2015-12-13 MED ORDER — CETYLPYRIDINIUM CHLORIDE 0.05 % MT LIQD: 7.0000 mL | Freq: Two times a day (BID) | OROMUCOSAL | Status: DC

## 2015-12-13 MED ORDER — APIXABAN 5 MG PO TABS
10.0000 mg | ORAL_TABLET | Freq: Two times a day (BID) | ORAL | Status: DC
  Administered 2015-12-13: 10 mg via ORAL
  Filled 2015-12-13: qty 2

## 2015-12-13 MED ORDER — CHLORHEXIDINE GLUCONATE CLOTH 2 % EX PADS: 6.0000 | MEDICATED_PAD | Freq: Every day | CUTANEOUS | Status: DC

## 2015-12-13 MED ORDER — APIXABAN 5 MG PO TABS: ORAL_TABLET | ORAL | Status: DC

## 2015-12-13 MED ORDER — MUPIROCIN 2 % EX OINT: 1.0000 "application " | TOPICAL_OINTMENT | Freq: Two times a day (BID) | CUTANEOUS | Status: DC

## 2015-12-13 MED ORDER — MUPIROCIN 2 % EX OINT
1.0000 | TOPICAL_OINTMENT | Freq: Two times a day (BID) | CUTANEOUS | Status: DC
Start: 2015-12-13 — End: 2015-12-13
  Administered 2015-12-13: 1 via NASAL
  Filled 2015-12-13: qty 22

## 2015-12-13 MED ORDER — HEPARIN (PORCINE) IN NACL 100-0.45 UNIT/ML-% IJ SOLN
1850.0000 [IU]/h | INTRAMUSCULAR | Status: DC
  Administered 2015-12-13: 1850 [IU]/h via INTRAVENOUS
  Filled 2015-12-13: qty 250

## 2015-12-13 NOTE — Progress Notes (Signed)
ANTICOAGULATION CONSULT NOTE - Initial Consult  Pharmacy Consult for heparin Indication: pulmonary embolus  No Known Allergies  Patient Measurements: Height: 6\' 1"  (185.4 cm) Weight: (!) 306 lb 14.1 oz (139.2 kg) IBW/kg (Calculated) : 79.9 Heparin Dosing Weight: 110kg  Vital Signs: Temp: 97.6 F (36.4 C) (04/10 0500) Temp Source: Oral (04/10 0500) BP: 136/87 mmHg (04/10 0500) Pulse Rate: 85 (04/10 0500)   Medical History: Past Medical History  Diagnosis Date  . Morbid obesity ([place])   . Pulmonary embolism ([place])   . DVT (deep venous thrombosis) (HCC)     Left lower extremity  . Pulmonary hypertension (HCC)     Moderate to severe  . Hypokalemia      Assessment: 50yo male w/ distant and recent h/o unprovoked and provoked DVT/PE presented to [facility] for SOB and LLE swelling/pain, CT reveals bilateral PE, to continue heparin started at OSH; of note pt completed 30mo course of Eliquis and was told to continue aspirin only, pt has heterozygous prothrombin gene mutation.  Goal of Therapy:  Heparin level 0.3-0.7 units/ml Monitor platelets by anticoagulation protocol: Yes   Plan:  Pt rec'd heparin 4000 units IV bolus x1 followed by gtt at 1850 units/hr at OSH; will continue for now and monitor heparin levels and CBC.  Wynona Neat, PharmD, BCPS  12/13/2015,6:25 AM

## 2015-12-13 NOTE — Progress Notes (Signed)
Pt ambulated in hall 300 ft 02 sat remained  93 to 95%. HR did raise ST to 120 than back to NSR 95 within 5 min after walk  No c/o chest pain Back to bed
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
## 2015-12-13 NOTE — Discharge Instructions (Signed)
Information on my medicine - ELIQUIS (apixaban)  This medication education was reviewed with me or my healthcare representative as part of my discharge preparation.   Why was Eliquis prescribed for you? Eliquis was prescribed to treat blood clots that may have been found in the veins of your legs (deep vein thrombosis) or in your lungs (pulmonary embolism) and to reduce the risk of them occurring again.  What do You need to know about Eliquis ? The starting dose is 10 mg (two 5 mg tablets) taken TWICE daily for the FIRST SEVEN (7) DAYS, then on 4/17  the dose is reduced to ONE 5 mg tablet taken TWICE daily.  Eliquis may be taken with or without food.   Try to take the dose about the same time in the morning and in the evening. If you have difficulty swallowing the tablet whole please discuss with your pharmacist how to take the medication safely.  Take Eliquis exactly as prescribed and DO NOT stop taking Eliquis without talking to the doctor who prescribed the medication.  Stopping may increase your risk of developing a new blood clot.  Refill your prescription before you run out.  After discharge, you should have regular check-up appointments with your healthcare provider that is prescribing your Eliquis.    What do you do if you miss a dose? If a dose of ELIQUIS is not taken at the scheduled time, take it as soon as possible on the same day and twice-daily administration should be resumed. The dose should not be doubled to make up for a missed dose.  Important Safety Information A possible side effect of Eliquis is bleeding. You should call your healthcare provider right away if you experience any of the following: ? Bleeding from an injury or your nose that does not stop. ? Unusual colored urine (red or dark brown) or unusual colored stools (red or black). ? Unusual bruising for unknown reasons. ? A serious fall or if you hit your head (even if there is no bleeding).  Some  medicines may interact with Eliquis and might increase your risk of bleeding or clotting while on Eliquis. To help avoid this, consult your healthcare provider or pharmacist prior to using any new prescription or non-prescription medications, including herbals, vitamins, non-steroidal anti-inflammatory drugs (NSAIDs) and supplements.  This website has more information on Eliquis (apixaban): http://www.eliquis.com/eliquis/home

## 2015-12-13 NOTE — Plan of Care (Signed)
Problem: Tissue Perfusion: Goal: Risk factors for ineffective tissue perfusion will decrease Outcome: Progressing Discussion with patient about Heparin gtt and previous Eliquis usage at home.

## 2015-12-13 NOTE — Progress Notes (Signed)
Went over all discharge instructions with PT. Given exit care notes on Eliquis which he briefly went over. Pt had been on Eliquis & it was stopped in February - he said he is very much aware of the drug & bleeding precautions.

## 2015-12-13 NOTE — Progress Notes (Addendum)
ANTICOAGULATION CONSULT NOTE - Initial Consult  Pharmacy Consult for heparin Indication: pulmonary embolus  No Known Allergies  Patient Measurements: Height: 6\' 1"  (185.4 cm) Weight: (!) 306 lb 14.1 oz (139.2 kg) IBW/kg (Calculated) : 79.9 Heparin Dosing Weight: 110kg  Vital Signs: Temp: 97.7 F (36.5 C) (04/10 0700) Temp Source: Oral (04/10 0700) BP: 128/92 mmHg (04/10 0700) Pulse Rate: 85 (04/10 0700)   Medical History: Past Medical History  Diagnosis Date  . Morbid obesity ([place])   . Pulmonary embolism ([place])   . DVT (deep venous thrombosis) (HCC)     Left lower extremity  . Pulmonary hypertension (H[facility])     Moderate to severe  . Hypokalemia      Assessment: 50yo male w/ distant and recent h/o unprovoked and provoked DVT/PE presented to [facility] for SOB and LLE swelling/pain, CT reveals bilateral PE, to continue heparin started at OSH; of note pt completed 51mo course of Eliquis and was told to continue aspirin only, pt has heterozygous prothrombin gene mutation. First HL here is therapeutic at 0.42. Hgb stable and plts 149. No s/s of bleed.  Goal of Therapy:  Heparin level 0.3-0.7 units/ml Monitor platelets by anticoagulation protocol: Yes   Plan:  Continue heparin gtt at 1,850 units/hr Check confirmatory HL in 6 hrs Monitor daily HL, CBC, s/s of bleed  Elenor Quinones, PharmD, BCPS Clinical Pharmacist Pager [phone] 12/13/2015 9:07 AM    ADDENDUM:  Pharmacy consulted to start Eliquis  Plan: Start Eliquis 10mg  PO BID x 7 days, then start Eliquis 5mg  PO BID  Stop heparin gtt once first dose given Monitor CBC, s/s of bleed
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
## 2015-12-13 NOTE — Care Management Note (Addendum)
Case Management Note  Patient Details  Name: Roderich Cockcroft MRN: [id number] Date of Birth: 1966/08/12  Subjective/Objective:         Pt admitted with PE           Action/Plan:  Pt is independent from home alone.  Pt states he has active BCBS and was previously on Eliquis up until 2/17 - pt denies hardship with paying for and or obtaining medication.   Pt has appt set up with PCP Dr Sheryle Hail 4/20 in [place].  Pt requested transportation home - CSW consulted.    Expected Discharge Date:                  Expected Discharge Plan:  Home/Self Care  In-House Referral:     Discharge planning Services  CM Consult, Medication Assistance  Post Acute Care Choice:    Choice offered to:     DME Arranged:    DME Agency:     HH Arranged:    [place] Agency:     Status of Service:  Completed, signed off  Medicare Important Message Given:    Date Medicare IM Given:    Medicare IM give by:    Date Additional Medicare IM Given:    Additional Medicare Important Message give by:     If discussed at [place] of Stay Meetings, dates discussed:    Additional Comments:  Maryclare Labrador, RN 12/13/2015, 3:16 PM

## 2015-12-13 NOTE — Progress Notes (Signed)
Started on [place] said to d/c Heparin gtt in 1 hr after PO Eliquis

## 2015-12-13 NOTE — Progress Notes (Signed)
  Echocardiogram 2D Echocardiogram has been performed.  Xavier Randolph M 12/13/2015, 11:49 AM

## 2015-12-13 NOTE — Progress Notes (Signed)
Pt discharged per w/c - will take cab home from hospital. Pt has all his belongings - will have to call for Dr Appt. follow up Office is already closed - pt aware

## 2015-12-17 ENCOUNTER — Telehealth: Payer: Self-pay | Admitting: Hematology

## 2015-12-17 NOTE — Telephone Encounter (Signed)
Late documentation.........Marland Kitchen Returned call to patient on 12/13/15 re rescheduling appointment. Gave patient new appointment for 4/17 @ 1 pm. Called patient at [phone].

## 2015-12-20 ENCOUNTER — Other Ambulatory Visit (HOSPITAL_BASED_OUTPATIENT_CLINIC_OR_DEPARTMENT_OTHER): Payer: BLUE CROSS/BLUE SHIELD

## 2015-12-20 ENCOUNTER — Ambulatory Visit (HOSPITAL_BASED_OUTPATIENT_CLINIC_OR_DEPARTMENT_OTHER): Payer: BLUE CROSS/BLUE SHIELD | Admitting: Hematology

## 2015-12-20 ENCOUNTER — Encounter: Payer: Self-pay | Admitting: Hematology

## 2015-12-20 VITALS — BP 126/77 | HR 97 | Temp 100.2°F | Resp 18 | Ht 73.0 in | Wt 306.1 lb

## 2015-12-20 DIAGNOSIS — I272 Other secondary pulmonary hypertension: Secondary | ICD-10-CM

## 2015-12-20 DIAGNOSIS — I82401 Acute embolism and thrombosis of unspecified deep veins of right lower extremity: Secondary | ICD-10-CM | POA: Diagnosis not present

## 2015-12-20 DIAGNOSIS — C7901 Secondary malignant neoplasm of right kidney and renal pelvis: Secondary | ICD-10-CM | POA: Diagnosis not present

## 2015-12-20 DIAGNOSIS — I2699 Other pulmonary embolism without acute cor pulmonale: Secondary | ICD-10-CM | POA: Diagnosis not present

## 2015-12-20 LAB — CBC & DIFF AND RETIC
BASO%: 0.2 % (ref 0.0–2.0)
BASOS ABS: 0 10*3/uL (ref 0.0–0.1)
EOS ABS: 0 10*3/uL (ref 0.0–0.5)
EOS%: 0.2 % (ref 0.0–7.0)
HCT: 43.7 % (ref 38.4–49.9)
HGB: 14.7 g/dL (ref 13.0–17.1)
IMMATURE RETIC FRACT: 5.8 % (ref 3.00–10.60)
LYMPH%: 12.8 % — AB (ref 14.0–49.0)
MCH: 29.9 pg (ref 27.2–33.4)
MCHC: 33.6 g/dL (ref 32.0–36.0)
MCV: 88.8 fL (ref 79.3–98.0)
MONO#: 0.6 10*3/uL (ref 0.1–0.9)
MONO%: 13.5 % (ref 0.0–14.0)
NEUT#: 3 10*3/uL (ref 1.5–6.5)
NEUT%: 73.3 % (ref 39.0–75.0)
PLATELETS: 137 10*3/uL — AB (ref 140–400)
RBC: 4.92 10*6/uL (ref 4.20–5.82)
RDW: 13.6 % (ref 11.0–14.6)
Retic %: 1.04 % (ref 0.80–1.80)
Retic Ct Abs: 51.17 10*3/uL (ref 34.80–93.90)
WBC: 4.1 10*3/uL (ref 4.0–10.3)
lymph#: 0.5 10*3/uL — ABNORMAL LOW (ref 0.9–3.3)

## 2015-12-20 LAB — COMPREHENSIVE METABOLIC PANEL
ALT: 34 U/L (ref 0–55)
ANION GAP: 8 meq/L (ref 3–11)
AST: 26 U/L (ref 5–34)
Albumin: 3.9 g/dL (ref 3.5–5.0)
Alkaline Phosphatase: 54 U/L (ref 40–150)
BILIRUBIN TOTAL: 0.46 mg/dL (ref 0.20–1.20)
BUN: 13 mg/dL (ref 7.0–26.0)
CO2: 24 mEq/L (ref 22–29)
CREATININE: 1.1 mg/dL (ref 0.7–1.3)
Calcium: 8.7 mg/dL (ref 8.4–10.4)
Chloride: 106 mEq/L (ref 98–109)
EGFR: 81 mL/min/{1.73_m2} — ABNORMAL LOW (ref >90)
GLUCOSE: 99 mg/dL (ref 70–140)
Potassium: 3.7 mEq/L (ref 3.5–5.1)
Sodium: 138 mEq/L (ref 136–145)
TOTAL PROTEIN: 6.7 g/dL (ref 6.4–8.3)

## 2015-12-20 MED ORDER — APIXABAN 5 MG PO TABS: 5.0000 mg | ORAL_TABLET | Freq: Two times a day (BID) | ORAL | Status: AC

## 2015-12-28 ENCOUNTER — Telehealth: Payer: Self-pay | Admitting: Hematology

## 2015-12-28 NOTE — Telephone Encounter (Signed)
Faxed pt medical records to [facility] [phone]

## 2016-01-23 NOTE — Progress Notes (Signed)
Xavier Randolph  HEMATOLOGY ONCOLOGY PROGRESS NOTE  Date of service: 12/20/2015   Patient Care Team: No Pcp Per Patient as PCP - General (General Practice)  CC: follow-up for management of venous thromboembolism with recurrent PE  Diagnosis:  1) recurrent venous thromboembolism somewhat unprovoked events.  provoking factors including long distance driving, obesity, heterozygous prothrombin gene mutation. #2 right lower extremity DVT thought to be triggered by long distance driving in [id number] treated with Apixaban for 6 months. #3 pulmonary hypertension moderate to severe- chronic venous thromboembolism worsens acute right ventricular pressure overload versus other etiology for chronic pulmonary hypertension #4 newly diagnosed heterozygous prothrombin gene mutation  Current Treatment:  Eliquis 5mg  po BID  INTERVAL HISTORY: Mr. Kingman is here for followup regarding his recurrent VTE.  He notes that he has been unable to follow with interventional radiology to get his IVC filter removed since it is not have anybody who could come with him for the procedure. Pending data he was admitted to the hospital on 12/13/2015 when shortness of breath and left lower extremity pain and swelling.  He reports that he has been missing his doses of Eliquis while he was traveling.  In the emergency room he was found to have bilateral PE with right heart strain and tachycardia unclear if these clots were old or new. He was started The heparin and then transitioned to Apixaban 10mg  po BID x 7 days and then back to 5mg  po BID. He notes that his breathing has improved again and his lower extremity swelling is better as well.  It has no chest been no overt dyspnea at this time in clinic.   REVIEW OF SYSTEMS:    10 Point review of systems of done and is negative except as noted above.  . Past Medical History  Diagnosis Date  . Morbid obesity ([place])   . Pulmonary embolism ([place])   . DVT (deep venous thrombosis) ([facility])    Left lower extremity  . Pulmonary hypertension ([facility])     Moderate to severe  . Hypokalemia     . Past Surgical History  Procedure Laterality Date  . Ivc filter placement ([facility] hx)  2016    . Social History  Substance Use Topics  . Smoking status: Never Smoker   . Smokeless tobacco: None  . Alcohol Use: Yes     Comment: occasional drinker    ALLERGIES:  has No Known Allergies.  MEDICATIONS:  Current Outpatient Prescriptions  Medication Sig Dispense Refill  . apixaban (ELIQUIS) 5 MG TABS tablet Take 1 tablet (5 mg total) by mouth 2 (two) times daily. 60 tablet 6  . mupirocin ointment (BACTROBAN) 2 % Place 1 application into the nose 2 (two) times daily. 22 g 0  . POTASSIUM GLUCONATE PO Take 1 tablet by mouth at bedtime.     No current facility-administered medications for this visit.    PHYSICAL EXAMINATION: ECOG PERFORMANCE STATUS: 1 - Symptomatic but completely ambulatory  . Filed Vitals:   12/20/15 1336  BP: 126/77  Pulse: 97  Temp: 100.2 F (37.9 C)  Resp: 18    Filed Weights   12/20/15 1336  Weight: 306 lb 1.6 oz (138.846 kg)   .Body mass index is 40.39 kg/(m^2).  GENERAL:alert, in no acute distress and comfortable SKIN: skin color, texture, turgor are normal, no rashes or significant lesions EYES: normal, conjunctiva are pink and non-injected, sclera clear OROPHARYNX:no exudate, no erythema and lips, buccal mucosa, and tongue normal  NECK: supple, no JVD, thyroid normal  size, non-tender, without nodularity LYMPH: no palpable lymphadenopathy in the cervical, axillary or inguinal LUNGS: clear to auscultation with normal respiratory effort HEART: regular rate & rhythm, no murmurs and no lower extremity edema ABDOMEN: abdomen obese soft, non-tender, normoactive bowel sounds  Musculoskeletal: no cyanosis of digits and no clubbing , decreased left lower extremity swelling and redness. PSYCH: alert & oriented x 3 with fluent speech NEURO: no focal  motor/sensory deficits LABORATORY DATA:   I have reviewed the data as listed  . CBC Latest Ref Rng 12/20/2015 12/13/2015 06/14/2015  WBC 4.0 - 10.3 10e3/uL 4.1 6.9 5.4  Hemoglobin 13.0 - 17.1 g/dL 14.7 14.2 14.3  Hematocrit 38.4 - 49.9 % 43.7 42.1 42.3  Platelets 140 - 400 10e3/uL 137(L) 149(L) 216    . CMP Latest Ref Rng 12/20/2015 12/13/2015 06/14/2015  Glucose 70 - 140 mg/dl 99 101(H) 87  BUN 7.0 - 26.0 mg/dL 13.0 15 12.2  Creatinine 0.7 - 1.3 mg/dL 1.1 0.99 0.9  Sodium 136 - 145 mEq/L 138 145 142  Potassium 3.5 - 5.1 mEq/L 3.7 3.8 4.0  Chloride 101 - 111 mmol/L - 109 -  CO2 22 - 29 mEq/L 24 26 27   Calcium 8.4 - 10.4 mg/dL 8.7 8.8(L) 9.3  Total Protein 6.4 - 8.3 g/dL 6.7 - 6.8  Total Bilirubin 0.20 - 1.20 mg/dL 0.46 - 0.44  Alkaline Phos 40 - 150 U/L 54 - 50  AST 5 - 34 U/L 26 - 20  ALT 0 - 55 U/L 34 - 43   Lupus anticoagulant negative Heterozygous prothrombin gene mutation noted  RADIOGRAPHIC STUDIES: I have personally reviewed the radiological images as listed and agreed with the findings in the report. No results found.   [place] ext 07/23/2015: Summary:  - Mild technical difficulty due to edema and body habitus. - Findings consistent with chronic deep vein thrombosis involving a  small segment of the popliteal vein in the right lower extremity. - No obvious evidence of deep vein thrombosis involving the left  lower extremity. - Unable to visualize the peroneal veins bilaterally well enough to  evaluate. Unable to visualize due due to edema and body habitus. - No obvious evidence of superficial thrombosis of the right or  left lower extremity.  Other specific details can be found in the table(s) above. Prepared and Electronically Authenticated by  Gae Gallop MD 2016-11-18T17:03:40  ASSESSMENT & PLAN:   50 year old gentleman with   #1 Extensive bilateral pulmonary embolism with Right lower extremity DVT. Patient has a family history with his dad having  some kind of clotting disorder. Hypercoagulable workup shows heterozygous prothrombin gene mutation which increases risk of venous thromboembolic 4-7 times the general population. He has previously had a right lower extremity DVT in 2014 that was thought to be related to long distance driving and was treated with Apixaban for 6 months. 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 Lower extremities  #2 Moderate pulmonary hypertension with right ventricular pressure overload related to extensive PE worse is chronic venous thromboembolism versus other etiology of pulmonary hypertension such as sleep apnea or COPD. Patient has been a lifelong nonsmoker. Echocardiogram recently showed apparently normal pulmonary pressures with normal right ventricular function. Normal left ventricular ejection fraction.  #3 s/p IVC filter placement done at OSH due to extensive PE's. Has not been able to have this removed due to the lack of family member/friend who can have time to accompany him for that procedure.  He intends to reschedule  this soon. He understands that the IVC filter itself could be thrombogenic.  Plan -recent hospitalization related events reviewed. -Patient was counseled on need for absolute compliance with his anticoagulation. -rescheduled followup to be evaluated for IVC filter removal by interventional radiology.  -continue using Jobst stockings to reduce symptoms of post-thrombotic syndrome. -continue to pursue lifelong anticoagulation with Apixaban if tolerated. After 6-12 months of treatment might consider stepping down to lower dose at 2.5 mg by mouth twice a day. -and diet and exercise to reduce weight close to ideal body weight.  . Orders Placed This Encounter  Procedures  . CBC & Diff and Retic    Standing Status: Future     Number of Occurrences:      Standing Expiration Date: 01/23/2017  . Comprehensive metabolic panel     Standing Status: Future     Number of Occurrences:      Standing Expiration Date: 12/19/2016     Return to care with Dr.Kale in 6 months with cbc,cmp.  Earlier if any new questions or concerns arise I spent 15 minutes counseling the patient face to face. The total time spent in the appointment was 20 minutes and more than 50% was on counseling and direct patient cares.    Sullivan Lone MD [place] AAHIVMS [facility] Hematology/Oncology Physician [facility]  (Office):       [phone] (Work cell):  [phone] (Fax):           [phone]

## 2016-06-16 ENCOUNTER — Telehealth: Payer: Self-pay | Admitting: Hematology

## 2016-06-16 NOTE — Telephone Encounter (Signed)
Patient called to reschedule appointment. 06/16/16

## 2016-06-20 ENCOUNTER — Ambulatory Visit: Payer: BLUE CROSS/BLUE SHIELD | Admitting: Hematology

## 2016-06-20 ENCOUNTER — Other Ambulatory Visit: Payer: BLUE CROSS/BLUE SHIELD

## 2016-07-17 ENCOUNTER — Telehealth: Payer: Self-pay | Admitting: Hematology

## 2016-07-17 NOTE — Telephone Encounter (Signed)
S/w pt, advised 11/17 appr moved to 11/28 @ 3.30 due to md pal. Pt verbalized understandind but noted that this was the 2nd r/s.

## 2016-07-21 ENCOUNTER — Other Ambulatory Visit: Payer: BLUE CROSS/BLUE SHIELD

## 2016-07-21 ENCOUNTER — Ambulatory Visit: Payer: BLUE CROSS/BLUE SHIELD | Admitting: Hematology

## 2016-08-01 ENCOUNTER — Other Ambulatory Visit (HOSPITAL_BASED_OUTPATIENT_CLINIC_OR_DEPARTMENT_OTHER): Payer: BLUE CROSS/BLUE SHIELD

## 2016-08-01 ENCOUNTER — Ambulatory Visit (HOSPITAL_BASED_OUTPATIENT_CLINIC_OR_DEPARTMENT_OTHER): Payer: BLUE CROSS/BLUE SHIELD | Admitting: Hematology

## 2016-08-01 ENCOUNTER — Encounter: Payer: Self-pay | Admitting: Hematology

## 2016-08-01 ENCOUNTER — Telehealth: Payer: Self-pay | Admitting: Hematology

## 2016-08-01 VITALS — BP 112/75 | HR 86 | Temp 98.0°F | Resp 18 | Ht 73.0 in | Wt 282.8 lb

## 2016-08-01 DIAGNOSIS — I2699 Other pulmonary embolism without acute cor pulmonale: Secondary | ICD-10-CM

## 2016-08-01 DIAGNOSIS — Z7901 Long term (current) use of anticoagulants: Secondary | ICD-10-CM

## 2016-08-01 DIAGNOSIS — I82401 Acute embolism and thrombosis of unspecified deep veins of right lower extremity: Secondary | ICD-10-CM

## 2016-08-01 DIAGNOSIS — I824Y9 Acute embolism and thrombosis of unspecified deep veins of unspecified proximal lower extremity: Secondary | ICD-10-CM

## 2016-08-01 DIAGNOSIS — I272 Pulmonary hypertension, unspecified: Secondary | ICD-10-CM

## 2016-08-01 DIAGNOSIS — Z95828 Presence of other vascular implants and grafts: Secondary | ICD-10-CM

## 2016-08-01 DIAGNOSIS — D6852 Prothrombin gene mutation: Secondary | ICD-10-CM

## 2016-08-01 DIAGNOSIS — I2724 Chronic thromboembolic pulmonary hypertension: Secondary | ICD-10-CM

## 2016-08-01 LAB — CBC & DIFF AND RETIC
BASO%: 0.1 % (ref 0.0–2.0)
BASOS ABS: 0 10*3/uL (ref 0.0–0.1)
EOS ABS: 0.1 10*3/uL (ref 0.0–0.5)
EOS%: 1.2 % (ref 0.0–7.0)
HEMATOCRIT: 41.5 % (ref 38.4–49.9)
HEMOGLOBIN: 14.3 g/dL (ref 13.0–17.1)
Immature Retic Fract: 1.8 % — ABNORMAL LOW (ref 3.00–10.60)
LYMPH%: 20.6 % (ref 14.0–49.0)
MCH: 30.4 pg (ref 27.2–33.4)
MCHC: 34.5 g/dL (ref 32.0–36.0)
MCV: 88.3 fL (ref 79.3–98.0)
MONO#: 0.5 10*3/uL (ref 0.1–0.9)
MONO%: 6.5 % (ref 0.0–14.0)
NEUT#: 5.2 10*3/uL (ref 1.5–6.5)
NEUT%: 71.6 % (ref 39.0–75.0)
Platelets: 203 10*3/uL (ref 140–400)
RBC: 4.7 10*6/uL (ref 4.20–5.82)
RDW: 13.3 % (ref 11.0–14.6)
Retic %: 1.13 % (ref 0.80–1.80)
Retic Ct Abs: 53.11 10*3/uL (ref 34.80–93.90)
WBC: 7.2 10*3/uL (ref 4.0–10.3)
lymph#: 1.5 10*3/uL (ref 0.9–3.3)

## 2016-08-01 LAB — COMPREHENSIVE METABOLIC PANEL
ALBUMIN: 3.8 g/dL (ref 3.5–5.0)
ALK PHOS: 69 U/L (ref 40–150)
ALT: 22 U/L (ref 0–55)
AST: 19 U/L (ref 5–34)
Anion Gap: 7 mEq/L (ref 3–11)
BUN: 12.7 mg/dL (ref 7.0–26.0)
CALCIUM: 9.2 mg/dL (ref 8.4–10.4)
CO2: 28 mEq/L (ref 22–29)
Chloride: 106 mEq/L (ref 98–109)
Creatinine: 0.9 mg/dL (ref 0.7–1.3)
Glucose: 95 mg/dl (ref 70–140)
POTASSIUM: 3.6 meq/L (ref 3.5–5.1)
Sodium: 141 mEq/L (ref 136–145)
Total Bilirubin: 0.5 mg/dL (ref 0.20–1.20)
Total Protein: 6.9 g/dL (ref 6.4–8.3)

## 2016-08-01 NOTE — Telephone Encounter (Signed)
IR called to schedule IVC filter removal in January. Left a message to have IR to call patient to schedule appointment with patient for January.

## 2016-08-01 NOTE — Telephone Encounter (Signed)
IR called to schedule appointment with patient. LVM

## 2016-08-01 NOTE — Patient Instructions (Signed)
-  F/u with Interventional Radiology to have IIV filter removed (Order placed again) -continue Lifelong therapeutic dose of Apixaban/Eliquis unless any other consideration with regarding to bleeding/procedures. -continue f/u with PCP for continued management /refills on your anticoagulants -plz call us if you have any other questions.

## 2016-08-02 ENCOUNTER — Other Ambulatory Visit: Payer: Self-pay | Admitting: Hematology

## 2016-08-02 DIAGNOSIS — Z95828 Presence of other vascular implants and grafts: Secondary | ICD-10-CM

## 2016-08-03 ENCOUNTER — Telehealth: Payer: Self-pay | Admitting: *Deleted

## 2016-08-03 NOTE — Telephone Encounter (Signed)
Left messages on hm# and cell# requesting pt call to schedule appt for potential IVC Filter retrieval. Also need additional info regarding IVC filter placement.(provider,facility,etc) Marcelyn Bruins

## 2016-08-10 ENCOUNTER — Telehealth: Payer: Self-pay | Admitting: *Deleted

## 2016-08-10 NOTE — Telephone Encounter (Signed)
lmom to schedule consult for potential IVC filter retrieval.

## 2016-08-14 NOTE — Progress Notes (Signed)
Xavier Randolph  HEMATOLOGY ONCOLOGY PROGRESS NOTE  Date of service: 08/01/2016   PCP:  [facility] [place]  CC: follow-up for management of venous thromboembolism with recurrent PE  Diagnosis:  1) recurrent venous thromboembolism somewhat unprovoked events.  provoking factors including long distance driving, obesity, heterozygous prothrombin gene mutation. #2 right lower extremity DVT thought to be triggered by long distance driving in [id number] treated with Apixaban for 6 months. 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 He continues to be compliant with his Eliquis and has not had any issues with new DVT or PE. He notes that he hasn't been able to find time or get any friends to go with them for the IVC removal procedure but still wants to pursue this and  requests a new referral which was provided . He has set up a primary care physician now who has been managing his medication refills . No issues with bleeding or excessive bruising . He has been working to lose weight and has lost 24 pounds since his last visit .is currently on phentermine for weight loss .  REVIEW OF SYSTEMS:    10 Point review of systems of done and is negative except as noted above.  . Past Medical History:  Diagnosis Date  . DVT (deep venous thrombosis) ([facility])    Left lower extremity  . Hypokalemia   . Morbid obesity ([place])   . Pulmonary embolism ([place])   . Pulmonary hypertension    Moderate to severe    . Past Surgical History:  Procedure Laterality Date  . IVC FILTER PLACEMENT ([facility])  2016    . Social History  Substance Use Topics  . Smoking status: Never Smoker  .  Smokeless tobacco: Never Used  . Alcohol use Yes     Comment: occasional drinker    ALLERGIES:  has No Known Allergies.  MEDICATIONS:  Current Outpatient Prescriptions  Medication Sig Dispense Refill  . apixaban (ELIQUIS) 5 MG TABS tablet Take 1 tablet (5 mg total) by mouth 2 (two) times daily. 60 tablet 6  . furosemide (LASIX) 20 MG tablet     . POTASSIUM GLUCONATE PO Take 1 tablet by mouth at bedtime.     No current facility-administered medications for this visit.     PHYSICAL EXAMINATION: ECOG PERFORMANCE STATUS: 1 - Symptomatic but completely ambulatory  . Vitals:   08/01/16 1601  BP: 112/75  Pulse: 86  Resp: 18  Temp: 98 F (36.7 C)    Filed Weights   08/01/16 1601  Weight: 282 lb 12.8 oz (128.3 kg)   .Body mass index is 37.31 kg/m.  . Wt Readings from Last 3 Encounters:  08/01/16 282 lb 12.8 oz (128.3 kg)  12/20/15 (!) 306 lb 1.6 oz (138.8 kg)  12/13/15 (!) 306 lb 14.1 oz (139.2 kg)    GENERAL:alert, in no acute distress and comfortable SKIN: skin color, texture, turgor are normal, no rashes or significant lesions EYES: normal, conjunctiva are pink and non-injected, sclera clear OROPHARYNX:no exudate, no erythema and lips, buccal mucosa, and tongue normal  NECK: supple, no JVD, thyroid normal size, non-tender, without nodularity LYMPH: no palpable lymphadenopathy in the cervical, axillary or inguinal LUNGS: clear to auscultation with normal respiratory effort HEART: regular  rate & rhythm, no murmurs and no lower extremity edema ABDOMEN: abdomen obese soft, non-tender, normoactive bowel sounds  Musculoskeletal: no cyanosis of digits and no clubbing , decreased left lower extremity swelling and redness. PSYCH: alert & oriented x 3 with fluent speech NEURO: no focal motor/sensory deficits  LABORATORY DATA:   I have reviewed the data as listed  . CBC Latest Ref Rng & Units 08/01/2016 12/20/2015 12/13/2015  WBC 4.0 - 10.3 10e3/uL 7.2 4.1 6.9    Hemoglobin 13.0 - 17.1 g/dL 14.3 14.7 14.2  Hematocrit 38.4 - 49.9 % 41.5 43.7 42.1  Platelets 140 - 400 10e3/uL 203 137(L) 149(L)    . CMP Latest Ref Rng & Units 08/01/2016 12/20/2015 12/13/2015  Glucose 70 - 140 mg/dl 95 99 101(H)  BUN 7.0 - 26.0 mg/dL 12.7 13.0 15  Creatinine 0.7 - 1.3 mg/dL 0.9 1.1 0.99  Sodium 136 - 145 mEq/L 141 138 145  Potassium 3.5 - 5.1 mEq/L 3.6 3.7 3.8  Chloride 101 - 111 mmol/L - - 109  CO2 22 - 29 mEq/L 28 24 26   Calcium 8.4 - 10.4 mg/dL 9.2 8.7 8.8(L)  Total Protein 6.4 - 8.3 g/dL 6.9 6.7 -  Total Bilirubin 0.20 - 1.20 mg/dL 0.50 0.46 -  Alkaline Phos 40 - 150 U/L 69 54 -  AST 5 - 34 U/L 19 26 -  ALT 0 - 55 U/L 22 34 -   Lupus anticoagulant negative Heterozygous prothrombin gene mutation noted  RADIOGRAPHIC STUDIES: I have personally reviewed the radiological images as listed and agreed with the findings in the report. No results found.   ASSESSMENT & PLAN:   50 year old gentleman with   #1 Extensive bilateral pulmonary embolism with Right lower extremity DVT. Patient has a family history with his dad having some kind of clotting disorder. Hypercoagulable workup shows heterozygous prothrombin gene mutation which increases risk of venous thromboembolic 4-7 times the general population. He has previously had a right lower extremity DVT in 2014 that was thought to be related to long distance driving and was treated with Apixaban for 6 months. 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  #2 Moderate pulmonary hypertension with right ventricular pressure overload related to extensive PE worse is chronic venous thromboembolism versus other etiology of pulmonary hypertension such as sleep apnea or COPD. Patient has been a lifelong nonsmoker. Echocardiogram recently showed apparently normal pulmonary pressures with normal right ventricular function. Normal left ventricular ejection fraction.  #3  s/p IVC filter placement done at OSH due to extensive PE's. Has not been able to have this removed due to the lack of family member/friend who can have time to accompany him for that procedure.  He intends to reschedule this soon. He understands that the IVC filter itself could be thrombogenic.  Plan -Patient is doing well and has lost about 24 pounds since his last visit . He was encouraged to continue working on this and, graduated for his effort . -Patient was counseled on need for absolute compliance with his anticoagulation and continue follow-up with his primary care physician for refill on his Apixaban . -rescheduled followup to be evaluated for IVC filter removal by interventional radiology.  -continue using Jobst stockings to reduce symptoms of post-thrombotic syndrome. -continue to pursue lifelong anticoagulation with Apixaban if tolerated.  - diet and exercise to reduce weight close to ideal body weight.  . Orders Placed This Encounter  Procedures  . IR IVC FILTER RETRIEVAL / S&I /IMG GUID/MOD  SED    Standing Status:   Future    Standing Expiration Date:   10/01/2017    Order Specific Question:   Reason for exam:    Answer:   IVC filter removal - placed in 05/2015 at outside facility    Order Specific Question:   Preferred Imaging Location?    Answer:   [facility]     Return to care with Dr.Andra Matsuo On an as-needed basis if any new questions or concerns arise.  I spent 20 minutes counseling the patient face to face. The total time spent in the appointment was 20 minutes and more than 50% was on counseling and direct patient cares.    Sullivan Lone MD [place] AAHIVMS [facility] Hematology/Oncology Physician [facility]  (Office):       [phone] (Work cell):  [phone] (Fax):           [phone]

## 2016-08-21 ENCOUNTER — Encounter: Payer: Self-pay | Admitting: Hematology

## 2016-08-21 NOTE — Progress Notes (Unsigned)
Received PA request for Eliquis via fax. Submitted via Cover my Meds.  Alphonso P Lloyd (Key: [id number])   This request has been approved. Please note any additional information provided by [employer] at the bottom of your screen.  Creedon P Saye KeyValente David - PA Case ID: [id number] Need help? Call us at [phone]  Outcome  Approvedtoday  CaseId:[id number];Product Name:Apixaban (Eliquis) PA - ESI;Status:Approved;Coverage Start Date:07/22/2016;Coverage End Date:08/21/2017;  Called [facility]([place]) to provide this update.

## 2016-08-25 ENCOUNTER — Telehealth: Payer: Self-pay | Admitting: Hematology

## 2016-08-25 NOTE — Telephone Encounter (Signed)
Faxed records to [facility] [phone]

## 2016-09-03 IMAGING — DX DG CHEST 2V
2 series · 2 of 2 positions shown · non-contrast
Comparison: None.

CLINICAL DATA: Pulmonary embolism

EXAM:
CHEST  2 VIEW

[chest pa]
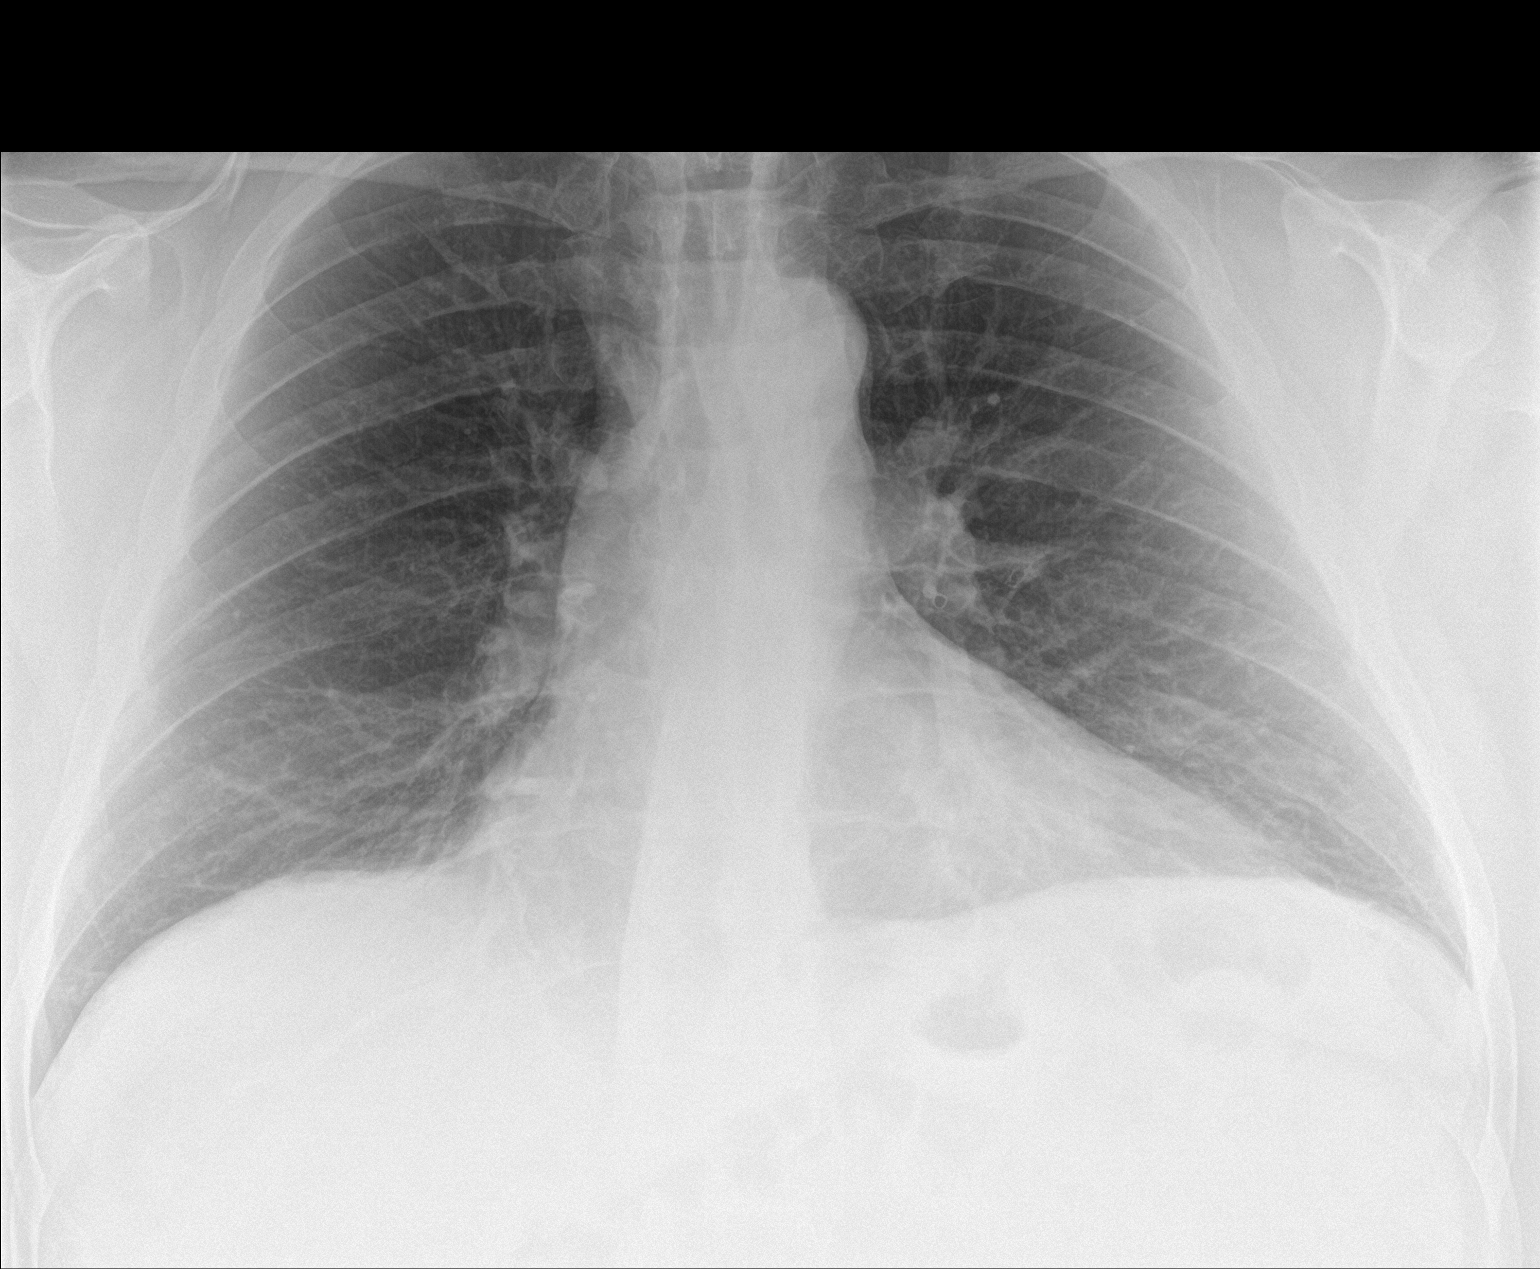

[chest lat]
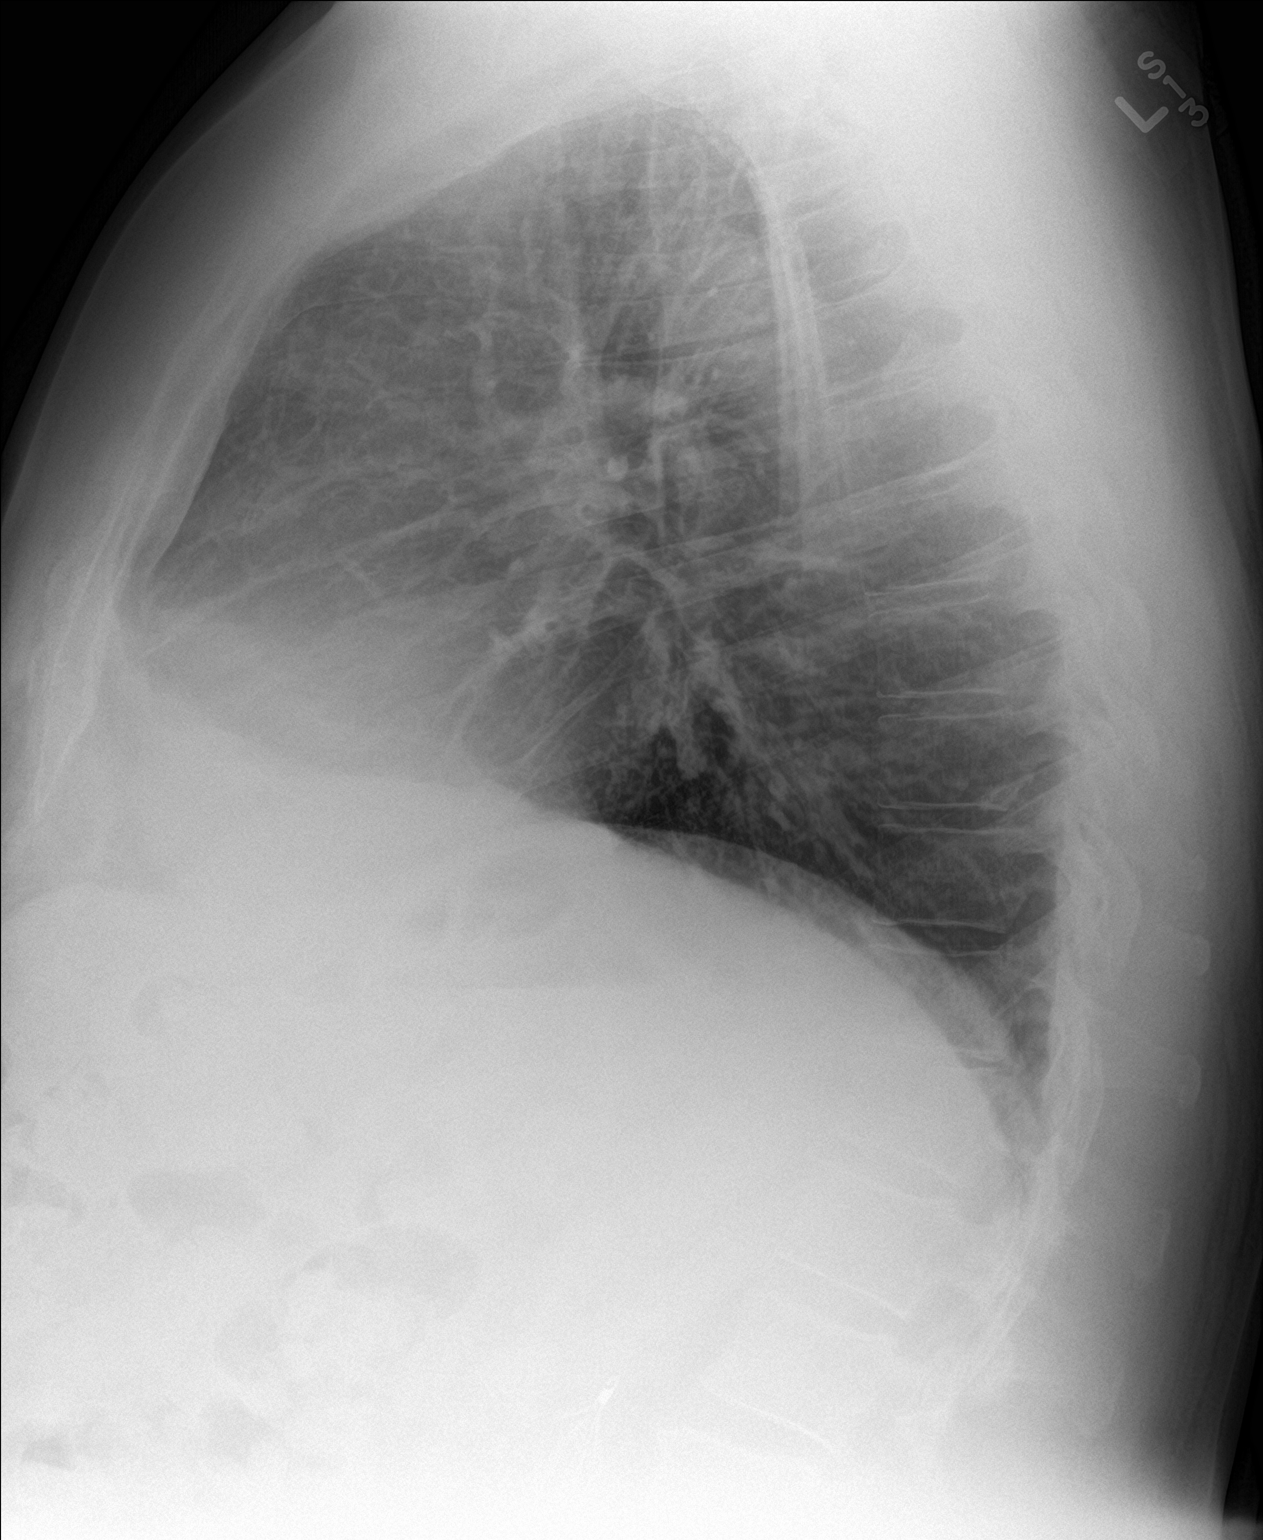

[2 of 2 positions shown; findings below may reference images not displayed]

FINDINGS: Lungs are under aerated with basilar atelectasis. Normal heart size.
No pneumothorax. No pleural effusion.
IMPRESSION: Basilar atelectasis.

## 2016-09-12 ENCOUNTER — Telehealth: Payer: Self-pay | Admitting: *Deleted

## 2016-09-12 ENCOUNTER — Encounter: Payer: Self-pay | Admitting: Radiology

## 2016-09-12 NOTE — Telephone Encounter (Signed)
I have called Xavier Randolph three different times and lft a msg for him to call for evaluation of the IVC Filter he has in place.Cathren Harsh
